# Patient Record
Sex: Male | Born: 1980 | Race: White | Hispanic: No | Marital: Married | Smoking: Current every day smoker
Health system: Southern US, Community
[De-identification: ages and names within clinical notes are randomized; demographics above are authoritative.]

## PROBLEM LIST (undated history)

## (undated) DIAGNOSIS — B019 Varicella without complication: Secondary | ICD-10-CM

## (undated) DIAGNOSIS — A770 Spotted fever due to Rickettsia rickettsii: Secondary | ICD-10-CM

## (undated) HISTORY — DX: Spotted fever due to Rickettsia rickettsii: A77.0

## (undated) HISTORY — DX: Varicella without complication: B01.9

---

## 2011-01-20 ENCOUNTER — Other Ambulatory Visit: Payer: Self-pay | Admitting: Obstetrics and Gynecology

## 2015-04-20 ENCOUNTER — Ambulatory Visit (INDEPENDENT_AMBULATORY_CARE_PROVIDER_SITE_OTHER): Payer: Self-pay | Admitting: Primary Care

## 2015-04-20 ENCOUNTER — Encounter: Payer: Self-pay | Admitting: *Deleted

## 2015-04-20 ENCOUNTER — Encounter: Payer: Self-pay | Admitting: Primary Care

## 2015-04-20 VITALS — BP 132/74 | HR 63 | Temp 97.5°F | Ht 71.5 in | Wt 154.1 lb

## 2015-04-20 DIAGNOSIS — Z0000 Encounter for general adult medical examination without abnormal findings: Secondary | ICD-10-CM | POA: Insufficient documentation

## 2015-04-20 DIAGNOSIS — Z720 Tobacco use: Secondary | ICD-10-CM

## 2015-04-20 LAB — CBC WITH DIFFERENTIAL/PLATELET
BASOS ABS: 0 10*3/uL (ref 0.0–0.1)
Basophils Relative: 0.3 % (ref 0.0–3.0)
EOS ABS: 0.2 10*3/uL (ref 0.0–0.7)
Eosinophils Relative: 1.4 % (ref 0.0–5.0)
HEMATOCRIT: 51.4 % (ref 39.0–52.0)
HEMOGLOBIN: 17.6 g/dL — AB (ref 13.0–17.0)
LYMPHS ABS: 2.4 10*3/uL (ref 0.7–4.0)
Lymphocytes Relative: 20.2 % (ref 12.0–46.0)
MCHC: 34.3 g/dL (ref 30.0–36.0)
MCV: 88.8 fl (ref 78.0–100.0)
MONO ABS: 0.7 10*3/uL (ref 0.1–1.0)
Monocytes Relative: 6.2 % (ref 3.0–12.0)
NEUTROS PCT: 71.9 % (ref 43.0–77.0)
Neutro Abs: 8.6 10*3/uL — ABNORMAL HIGH (ref 1.4–7.7)
Platelets: 247 10*3/uL (ref 150.0–400.0)
RBC: 5.78 Mil/uL (ref 4.22–5.81)
RDW: 12.8 % (ref 11.5–15.5)
WBC: 12 10*3/uL — AB (ref 4.0–10.5)

## 2015-04-20 LAB — LIPID PANEL
CHOLESTEROL: 177 mg/dL (ref 0–200)
HDL: 34.1 mg/dL — AB (ref >39.00)
LDL Cholesterol: 108 mg/dL — ABNORMAL HIGH (ref 0–99)
NonHDL: 142.9
Total CHOL/HDL Ratio: 5
Triglycerides: 175 mg/dL — ABNORMAL HIGH (ref 0.0–149.0)
VLDL: 35 mg/dL (ref 0.0–40.0)

## 2015-04-20 LAB — COMPREHENSIVE METABOLIC PANEL
ALT: 14 U/L (ref 0–53)
AST: 16 U/L (ref 0–37)
Albumin: 4.6 g/dL (ref 3.5–5.2)
Alkaline Phosphatase: 90 U/L (ref 39–117)
BILIRUBIN TOTAL: 0.6 mg/dL (ref 0.2–1.2)
BUN: 14 mg/dL (ref 6–23)
CALCIUM: 9.5 mg/dL (ref 8.4–10.5)
CHLORIDE: 102 meq/L (ref 96–112)
CO2: 30 mEq/L (ref 19–32)
CREATININE: 0.99 mg/dL (ref 0.40–1.50)
GFR: 91.44 mL/min (ref >60.00)
Glucose, Bld: 72 mg/dL (ref 70–99)
Potassium: 4.2 mEq/L (ref 3.5–5.1)
SODIUM: 138 meq/L (ref 135–145)
TOTAL PROTEIN: 7.3 g/dL (ref 6.0–8.3)

## 2015-04-20 LAB — TSH: TSH: 1.49 u[IU]/mL (ref 0.35–4.50)

## 2015-04-20 LAB — HEMOGLOBIN A1C: HEMOGLOBIN A1C: 5.3 % (ref 4.6–6.5)

## 2015-04-20 NOTE — Patient Instructions (Signed)
It's important to decrease your sugar intake in the form of sodas and to increase water intake. You should be getting 3 liters of water daily. Try the nicotine patches and gum for tobacco cessation. Apply the patch and use the gum as needed for cravings. Complete lab work prior to leaving today. I will notify you of your results. It was a pleasure to meet you today! Please don't hesitate to call me with any questions. Welcome to [facility]!

## 2015-04-20 NOTE — Assessment & Plan Note (Signed)
Smokes 1 PPD x 20 years. He is ready to quit and has attempted several times in the past by quitting "cold [place]". Discussed the long term health effects of smoking. Suggested he try the nicotine patches with the gum as needed for oral cravings. Will continue to monitor.

## 2015-04-20 NOTE — Progress Notes (Signed)
Subjective:    Patient ID: Ethan Cline, male    DOB: 1981-11-01, 34 y.o.   MRN: [id number]  HPI  Ethan Cline is a 34 year old male who presents today to establish care and for complete physical.   1) Migraines: Present in the 8th grade with photophobia, phonophobia, nausea. He's not had migraines in over 10 years.  2) Alopecia: Reports chunks of hair loss several years ago. At the time he was going through a lot of personal stress. He was evaluated in the past by MD who attributed his hair loss to stress. Patient reports no labs were obtained. His hair grew back and has had no loss since.   Immunizations: -Tetanus: Completed 6 years ago. -Influenza: Did not receive last season.  Diet: Meat, potatoes, bread, beans, pasta. Limited vegetables, fruit, fast food, water. He drinks mostly sodas, occasional sweet tea. Exercise: He does not regularly exercise but has an active lifestyle.  Eye exam: None recently. Dental exam: Completed 3 months ago   Review of Systems  Constitutional: Negative for fatigue and unexpected weight change.  HENT: Negative for rhinorrhea.   Respiratory: Negative for cough and shortness of breath.   Cardiovascular: Negative for chest pain.  Gastrointestinal: Negative for diarrhea and constipation.  Genitourinary: Negative for dysuria and frequency.  Musculoskeletal: Negative for myalgias and arthralgias.  Skin: Negative for rash.  Allergic/Immunologic: Positive for environmental allergies.  Neurological: Negative for dizziness and headaches.  Psychiatric/Behavioral:       Denies concerns for anxiety or depression       Past Medical History  Diagnosis Date  . Chicken pox   . Rocky Mountain spotted fever     History   Social History  . Marital Status: Married    Spouse Name: N/A  . Number of Children: N/A  . Years of Education: N/A   Occupational History  . Not on file.   Social History Main Topics  . Smoking status: Current Every Day Smoker --  0.50 packs/day for 20 years    Types: Cigarettes  . Smokeless tobacco: Not on file  . Alcohol Use: No  . Drug Use: Not on file  . Sexual Activity: Not on file   Other Topics Concern  . Not on file   Social History Narrative   Married.   1 child.   Highest level of education High School.   Works as a Camera operator.   Enjoys boating, fishing, being outdoors, snowboarding.    History reviewed. No pertinent past surgical history.  Family History  Problem Relation Age of Onset  . Arthritis Mother   . Heart attack Father 71    No Known Allergies  No current outpatient prescriptions on file prior to visit.   No current facility-administered medications on file prior to visit.    BP 132/74 mmHg  Pulse 63  Temp(Src) 97.5 F (36.4 C) (Oral)  Ht 5' 11.5" (1.816 m)  Wt 154 lb 1.9 oz (69.908 kg)  BMI 21.20 kg/m2  SpO2 97%    Objective:   Physical Exam  Constitutional: He is oriented to person, place, and time. He appears well-nourished.  HENT:  Right Ear: Tympanic membrane and ear canal normal.  Left Ear: Tympanic membrane and ear canal normal.  Nose: Nose normal.  Mouth/Throat: Oropharynx is clear and moist.  Eyes: Conjunctivae and EOM are normal. Pupils are equal, round, and reactive to light.  Neck: Neck supple. No thyromegaly present.  Cardiovascular: Normal rate and regular rhythm.  Pulmonary/Chest: Effort normal and breath sounds normal.  Abdominal: Soft. Bowel sounds are normal. He exhibits no mass. There is no tenderness.  Musculoskeletal: Normal range of motion.  Lymphadenopathy:    He has no cervical adenopathy.  Neurological: He is alert and oriented to person, place, and time. He has normal reflexes.  Skin: Skin is warm and dry.  Psychiatric: He has a normal mood and affect.          Assessment & Plan:

## 2015-04-20 NOTE — Progress Notes (Signed)
Pre visit review using our clinic review tool, if applicable. No additional management support is needed unless otherwise documented below in the visit note. 

## 2015-04-20 NOTE — Assessment & Plan Note (Addendum)
Tetanus up to date. Fair diet with the exception of soda consumption. Discussed the importance of cutting back and increasing water intake. Also discussed the importance of tobacco cessation. Recommended eye exam. Dental exam completed this year. Labs today. Unremarkable exam.

## 2015-04-24 ENCOUNTER — Telehealth: Payer: Self-pay | Admitting: Primary Care

## 2015-04-24 NOTE — Telephone Encounter (Signed)
Wife dropped off form for work. Please call [phone] when ready to pick up, thanks.

## 2015-04-24 NOTE — Telephone Encounter (Signed)
Placed form for [name]'s inbox to complete. Will call patient when ready.

## 2015-04-24 NOTE — Telephone Encounter (Signed)
Completed and ready for pick up. Placed in [name]'s Inbox.

## 2015-04-25 NOTE — Telephone Encounter (Signed)
Left voicemail for patient to let him know form is ready for pick up. Left in front office.

## 2015-04-26 ENCOUNTER — Telehealth: Payer: Self-pay | Admitting: Primary Care

## 2015-04-26 NOTE — Telephone Encounter (Signed)
We have attempted multiple times to reach Mr. Donia with multiple voice mails. Spoke with his wife notifying her that a letter was sent to the house with his results. She thanked me for the call.

## 2015-04-26 NOTE — Telephone Encounter (Signed)
Pt's wife picked up work form and asked about his lab results stating they have not heard anything yet. They were expecting a call on Monday. Pt can be reached at [phone].

## 2016-05-05 ENCOUNTER — Ambulatory Visit (INDEPENDENT_AMBULATORY_CARE_PROVIDER_SITE_OTHER)
Admission: RE | Admit: 2016-05-05 | Discharge: 2016-05-05 | Disposition: A | Discharge status: Home / Self Care | Payer: Commercial Managed Care - PPO | Source: Ambulatory Visit | Attending: Primary Care | Admitting: Primary Care

## 2016-05-05 ENCOUNTER — Encounter: Payer: Self-pay | Admitting: Primary Care

## 2016-05-05 ENCOUNTER — Ambulatory Visit (INDEPENDENT_AMBULATORY_CARE_PROVIDER_SITE_OTHER): Payer: Commercial Managed Care - PPO | Admitting: Primary Care

## 2016-05-05 VITALS — BP 132/78 | HR 60 | Temp 98.0°F | Ht 71.5 in | Wt 144.8 lb

## 2016-05-05 DIAGNOSIS — R05 Cough: Secondary | ICD-10-CM | POA: Diagnosis not present

## 2016-05-05 DIAGNOSIS — R059 Cough, unspecified: Secondary | ICD-10-CM

## 2016-05-05 MED ORDER — AMOXICILLIN-POT CLAVULANATE 875-125 MG PO TABS: 1.0000 | ORAL_TABLET | Freq: Two times a day (BID) | ORAL | Status: DC

## 2016-05-05 NOTE — Patient Instructions (Signed)
Start Augmentin antibiotics. Take 1 tablet by mouth twice daily for 10 days.  Cough/Congestion: Try taking Mucinex DM. This will help loosen up the mucous in your chest. Ensure you take this medication with a full glass of water.  You may take tylenol or advil as needed for fever and body aches.  Please notify me if no improvement in 3-4 days.  It was a pleasure to see you today!

## 2016-05-05 NOTE — Progress Notes (Signed)
Subjective:    Patient ID: Ethan Cline, male    DOB: 06/19/1981, 35 y.o.   MRN: [id number]  HPI  Mr. Ethan Cline is a 35 year old male who presents today with a chief complaint of fatigue. He also reports chills, night sweats, cough, post nasal drip. He's not checked his temperature except for yesterday which was 101. His symptoms have been present since Friday June 9th and has continued since. He recently returned from a beach trip. His cough is productive with yellow/clear sputum. He's taken tylenol sinus and Dayquil with some improvement. Denies sinus pressure, sore throat, sick contacts.Current smoker.  Review of Systems  Constitutional: Positive for fever, chills and fatigue.  HENT: Positive for postnasal drip and rhinorrhea. Negative for congestion, sinus pressure and sore throat.   Respiratory: Positive for cough. Negative for shortness of breath.   Cardiovascular: Negative for chest pain.  Musculoskeletal: Negative for myalgias.       Past Medical History  Diagnosis Date  . Chicken pox   . Rocky Mountain spotted fever      Social History   Social History  . Marital Status: Married    Spouse Name: N/A  . Number of Children: N/A  . Years of Education: N/A   Occupational History  . Not on file.   Social History Main Topics  . Smoking status: Current Every Day Smoker -- 0.50 packs/day for 20 years    Types: Cigarettes  . Smokeless tobacco: Not on file  . Alcohol Use: No  . Drug Use: Not on file  . Sexual Activity: Not on file   Other Topics Concern  . Not on file   Social History Narrative   Married.   1 child.   Highest level of education High School.   Works as a Camera operatorcontractor installing fireplaces.   Enjoys boating, fishing, being outdoors, snowboarding.    No past surgical history on file.  Family History  Problem Relation Age of Onset  . Arthritis Mother   . Heart attack Father [phone]    No Known Allergies  No current outpatient prescriptions on file prior  to visit.   No current facility-administered medications on file prior to visit.    BP 132/78 mmHg  Pulse 60  Temp(Src) 98 F (36.7 C) (Oral)  Ht 5' 11.5" (1.816 m)  Wt 144 lb 12.8 oz (65.681 kg)  BMI 19.92 kg/m2  SpO2 95%    Objective:   Physical Exam  Constitutional: He appears well-nourished. He appears ill.  HENT:  Right Ear: Tympanic membrane and ear canal normal.  Left Ear: Tympanic membrane and ear canal normal.  Nose: No mucosal edema. Right sinus exhibits no maxillary sinus tenderness and no frontal sinus tenderness. Left sinus exhibits no maxillary sinus tenderness and no frontal sinus tenderness.  Mouth/Throat: Oropharynx is clear and moist.  Eyes: Conjunctivae are normal.  Neck: Neck supple.  Cardiovascular: Normal rate and regular rhythm.   Pulmonary/Chest: Effort normal. He has no decreased breath sounds. He has no wheezes. He has rhonchi in the right upper field, the right middle field, the right lower field, the left upper field, the left middle field and the left lower field.  Skin: Skin is warm and dry.          Assessment & Plan:  Acute bronchitis:  Cough, fatigue, chills, rhinorrhea for the past 10 days. Fever of 101 yesterday, improvement with Tylenol. Overall symptoms are about the same and cough is productive with yellow clear sputum.  Exam today with moderate rhonchi throughout all lung fields, appears sick. Given examination, presentation, and duration of symptoms will treat and also obtain chest x-ray to rule out pneumonia. Chest x-ray negative for pneumonia but evidence of COPD/likely acute bronchitis. Prescription for Augmentin course sent to pharmacy. Increase fluids, start Mucinex. Return precautions provided.

## 2016-05-05 NOTE — Progress Notes (Signed)
Pre visit review using our clinic review tool, if applicable. No additional management support is needed unless otherwise documented below in the visit note. 

## 2016-05-23 ENCOUNTER — Other Ambulatory Visit: Payer: Self-pay | Admitting: Primary Care

## 2016-05-23 DIAGNOSIS — Z0000 Encounter for general adult medical examination without abnormal findings: Secondary | ICD-10-CM

## 2016-05-23 DIAGNOSIS — E785 Hyperlipidemia, unspecified: Secondary | ICD-10-CM

## 2016-05-28 ENCOUNTER — Other Ambulatory Visit (INDEPENDENT_AMBULATORY_CARE_PROVIDER_SITE_OTHER): Payer: Commercial Managed Care - PPO

## 2016-05-28 DIAGNOSIS — Z0000 Encounter for general adult medical examination without abnormal findings: Secondary | ICD-10-CM | POA: Diagnosis not present

## 2016-05-28 DIAGNOSIS — E785 Hyperlipidemia, unspecified: Secondary | ICD-10-CM

## 2016-05-28 LAB — LIPID PANEL
Cholesterol: 193 mg/dL (ref 0–200)
HDL: 39.4 mg/dL (ref >39.00)
NONHDL: 153.11
Total CHOL/HDL Ratio: 5
Triglycerides: 272 mg/dL — ABNORMAL HIGH (ref 0.0–149.0)
VLDL: 54.4 mg/dL — AB (ref 0.0–40.0)

## 2016-05-28 LAB — COMPREHENSIVE METABOLIC PANEL
ALT: 19 U/L (ref 0–53)
AST: 16 U/L (ref 0–37)
Albumin: 4.4 g/dL (ref 3.5–5.2)
Alkaline Phosphatase: 82 U/L (ref 39–117)
BUN: 16 mg/dL (ref 6–23)
CO2: 29 mEq/L (ref 19–32)
Calcium: 9.6 mg/dL (ref 8.4–10.5)
Chloride: 102 mEq/L (ref 96–112)
Creatinine, Ser: 0.97 mg/dL (ref 0.40–1.50)
GFR: 93.03 mL/min (ref >60.00)
GLUCOSE: 90 mg/dL (ref 70–99)
POTASSIUM: 4.5 meq/L (ref 3.5–5.1)
SODIUM: 139 meq/L (ref 135–145)
Total Bilirubin: 0.7 mg/dL (ref 0.2–1.2)
Total Protein: 6.9 g/dL (ref 6.0–8.3)

## 2016-05-28 LAB — LDL CHOLESTEROL, DIRECT: LDL DIRECT: 130 mg/dL

## 2016-06-02 ENCOUNTER — Encounter: Payer: Self-pay | Admitting: Primary Care

## 2016-06-02 ENCOUNTER — Ambulatory Visit (INDEPENDENT_AMBULATORY_CARE_PROVIDER_SITE_OTHER): Payer: Commercial Managed Care - PPO | Admitting: Primary Care

## 2016-06-02 VITALS — BP 116/76 | HR 73 | Temp 98.4°F | Ht 71.5 in | Wt 153.8 lb

## 2016-06-02 DIAGNOSIS — Z0000 Encounter for general adult medical examination without abnormal findings: Secondary | ICD-10-CM | POA: Diagnosis not present

## 2016-06-02 DIAGNOSIS — E785 Hyperlipidemia, unspecified: Secondary | ICD-10-CM | POA: Diagnosis not present

## 2016-06-02 NOTE — Progress Notes (Signed)
Pre visit review using our clinic review tool, if applicable. No additional management support is needed unless otherwise documented below in the visit note. 

## 2016-06-02 NOTE — Progress Notes (Signed)
Subjective:    Patient ID: Hillard DankerJames H Coomes, male    DOB: 12/17/1980, 35 y.o.   MRN: [id number]  HPI  [name] is a 35 year old male who presents today for complete physical.  Immunizations: -Tetanus: Completed in 2010 -Influenza: Did not receive last seasn   Diet: He endorses a fair diet Breakfast: Biscuit occasionally, ham Lunch: Ham sandwich, crackers Dinner: Meat, potatoes, beans, corn, bread Snacks: None Desserts: Ice cream, occasionally Beverages: Water, Gatorade, sodas  Exercise: He does not currently exercise. Eye exam: Completed several years ago. Dental exam: Completes annually   Review of Systems  Constitutional: Negative for unexpected weight change.  HENT: Negative for rhinorrhea.   Respiratory: Negative for cough and shortness of breath.   Cardiovascular: Negative for chest pain.  Gastrointestinal: Negative for diarrhea and constipation.  Genitourinary: Negative for difficulty urinating.  Musculoskeletal: Negative for myalgias and arthralgias.  Skin: Negative for rash.  Allergic/Immunologic: Negative for environmental allergies.  Neurological: Negative for dizziness, numbness and headaches.  Psychiatric/Behavioral:       Denies concerns for anxiety or depression       Past Medical History  Diagnosis Date  . Chicken pox   . Rocky Mountain spotted fever      Social History   Social History  . Marital Status: Married    Spouse Name: N/A  . Number of Children: N/A  . Years of Education: N/A   Occupational History  . Not on file.   Social History Main Topics  . Smoking status: Current Every Day Smoker -- 0.50 packs/day for 20 years    Types: Cigarettes  . Smokeless tobacco: Not on file  . Alcohol Use: No  . Drug Use: Not on file  . Sexual Activity: Not on file   Other Topics Concern  . Not on file   Social History Narrative   Married.   1 child.   Highest level of education High School.   Works as a Camera operatorcontractor installing fireplaces.   Enjoys boating, fishing, being outdoors, snowboarding.    No past surgical history on file.  Family History  Problem Relation Age of Onset  . Arthritis Mother   . Heart attack Father [phone]    No Known Allergies  No current outpatient prescriptions on file prior to visit.   No current facility-administered medications on file prior to visit.    BP 116/76 mmHg  Pulse 73  Temp(Src) 98.4 F (36.9 C) (Oral)  Ht 5' 11.5" (1.816 m)  Wt 153 lb 12.8 oz (69.763 kg)  BMI 21.15 kg/m2  SpO2 98%    Objective:   Physical Exam  Constitutional: He is oriented to person, place, and time. He appears well-nourished.  HENT:  Right Ear: Tympanic membrane and ear canal normal.  Left Ear: Tympanic membrane and ear canal normal.  Nose: Nose normal. Right sinus exhibits no maxillary sinus tenderness and no frontal sinus tenderness. Left sinus exhibits no maxillary sinus tenderness and no frontal sinus tenderness.  Mouth/Throat: Oropharynx is clear and moist.  Eyes: Conjunctivae and EOM are normal. Pupils are equal, round, and reactive to light.  Neck: Neck supple. Carotid bruit is not present. No thyromegaly present.  Cardiovascular: Normal rate, regular rhythm and normal heart sounds.   Pulmonary/Chest: Effort normal and breath sounds normal. He has no wheezes. He has no rales.  Abdominal: Soft. Bowel sounds are normal. There is no tenderness.  Musculoskeletal: Normal range of motion.  Neurological: He is alert and oriented to person, place,  and time. He has normal reflexes. No cranial nerve deficit.  Skin: Skin is warm and dry.  Psychiatric: He has a normal mood and affect.          Assessment & Plan:

## 2016-06-02 NOTE — Patient Instructions (Signed)
Your triglyceride levels are too high. Work to [employer] by limiting fried foods, fatty foods, starchy foods.  Start Fish Oil 1000 mg once daily with a meal.  Ensure you are consuming 64 ounces of water daily.  Start exercising. You should be getting 1 hour of moderate intensity exercise 5 days weekly.  Schedule a lab only appointment to recheck your cholesterol in 4 months.  Follow up in 1 year for repeat physical exam.  It was a pleasure to see you today!  Food Choices to Lower Your Triglycerides Triglycerides are a type of fat in your blood. High levels of triglycerides can increase the risk of heart disease and stroke. If your triglyceride levels are high, the foods you eat and your eating habits are very important. Choosing the right foods can help lower your triglycerides.  WHAT GENERAL GUIDELINES DO I NEED TO FOLLOW?  Lose weight if you are overweight.   Limit or avoid alcohol.   Fill one half of your plate with vegetables and green salads.   Limit fruit to two servings a day. Choose fruit instead of juice.   Make one fourth of your plate whole grains. Look for the word "whole" as the first word in the ingredient list.  Fill one fourth of your plate with lean protein foods.  Enjoy fatty fish (such as salmon, mackerel, sardines, and tuna) three times a week.   Choose healthy fats.   Limit foods high in starch and sugar.  Eat more home-cooked food and less restaurant, buffet, and fast food.  Limit fried foods.  [place] foods using methods other than frying.  Limit saturated fats.  Check ingredient lists to avoid foods with partially hydrogenated oils (trans fats) in them. WHAT FOODS CAN I EAT?  Grains Whole grains, such as whole wheat or whole grain breads, crackers, cereals, and pasta. Unsweetened oatmeal, bulgur, barley, quinoa, or brown rice. Corn or whole wheat flour tortillas.  Vegetables Fresh or frozen vegetables (raw, steamed, roasted, or  grilled). Green salads. Fruits All fresh, canned (in natural juice), or frozen fruits. Meat and Other Protein Products Ground beef (85% or leaner), grass-fed beef, or beef trimmed of fat. Skinless chicken or [place]. Ground chicken or [place]. Pork trimmed of fat. All fish and seafood. Eggs. Dried beans, peas, or lentils. Unsalted nuts or seeds. Unsalted canned or dry beans. Dairy Low-fat dairy products, such as skim or 1% milk, 2% or reduced-fat cheeses, low-fat ricotta or cottage cheese, or plain low-fat yogurt. Fats and Oils Tub margarines without trans fats. Light or reduced-fat mayonnaise and salad dressings. Avocado. Safflower, olive, or canola oils. Natural peanut or almond butter. The items listed above may not be a complete list of recommended foods or beverages. Contact your dietitian for more options. WHAT FOODS ARE NOT RECOMMENDED?  Grains White bread. White pasta. White rice. Cornbread. Bagels, pastries, and croissants. Crackers that contain trans fat. Vegetables White potatoes. Corn. Creamed or fried vegetables. Vegetables in a cheese sauce. Fruits Dried fruits. Canned fruit in light or heavy syrup. Fruit juice. Meat and Other Protein Products Fatty cuts of meat. Ribs, chicken wings, bacon, sausage, bologna, salami, chitterlings, fatback, hot dogs, bratwurst, and packaged luncheon meats. Dairy Whole or 2% milk, cream, half-and-half, and cream cheese. Whole-fat or sweetened yogurt. Full-fat cheeses. Nondairy creamers and whipped toppings. Processed cheese, cheese spreads, or cheese curds. Sweets and Desserts Corn syrup, sugars, honey, and molasses. Candy. Jam and jelly. Syrup. Sweetened cereals. Cookies, pies, cakes, donuts, muffins, and ice cream.  Fats and Oils Butter, stick margarine, lard, shortening, ghee, or bacon fat. Coconut, palm kernel, or palm oils. Beverages Alcohol. Sweetened drinks (such as sodas, lemonade, and fruit drinks or punches). The items listed above may  not be a complete list of foods and beverages to avoid. Contact your dietitian for more information.   This information is not intended to replace advice given to you by your health care provider. Make sure you discuss any questions you have with your health care provider.   Document Released: 08/21/2004 Document Revised: 11/24/2014 Document Reviewed: 09/07/2013 Elsevier Interactive Patient Education [employer]2016 Elsevier Inc.

## 2016-06-02 NOTE — Assessment & Plan Note (Signed)
Td UTD. Poor diet with high salt content, no vegetables. Does not exercise. Discussed the importance of a healthy diet and regular exercise in order for weight loss and to reduce risk of other medical diseases. Labs with elevated triglycerides, borderline LDL. Handout provided regarding food choices to lower cholesterol. Will recheck in 4 months. Exam unremarkable. Follow up in 1 year for repeat physical.

## 2016-10-03 ENCOUNTER — Other Ambulatory Visit: Payer: Commercial Managed Care - PPO

## 2017-03-04 IMAGING — DX DG CHEST 2V
2 series · 2 of 2 positions shown · non-contrast
Comparison: None in PACs

CLINICAL DATA: Shortness of breath and productive cough and chest
discomfort with exertion, also intermittent fever, symptoms for the
past several weeks, current smoker.

EXAM:
CHEST  2 VIEW

[chest lat]
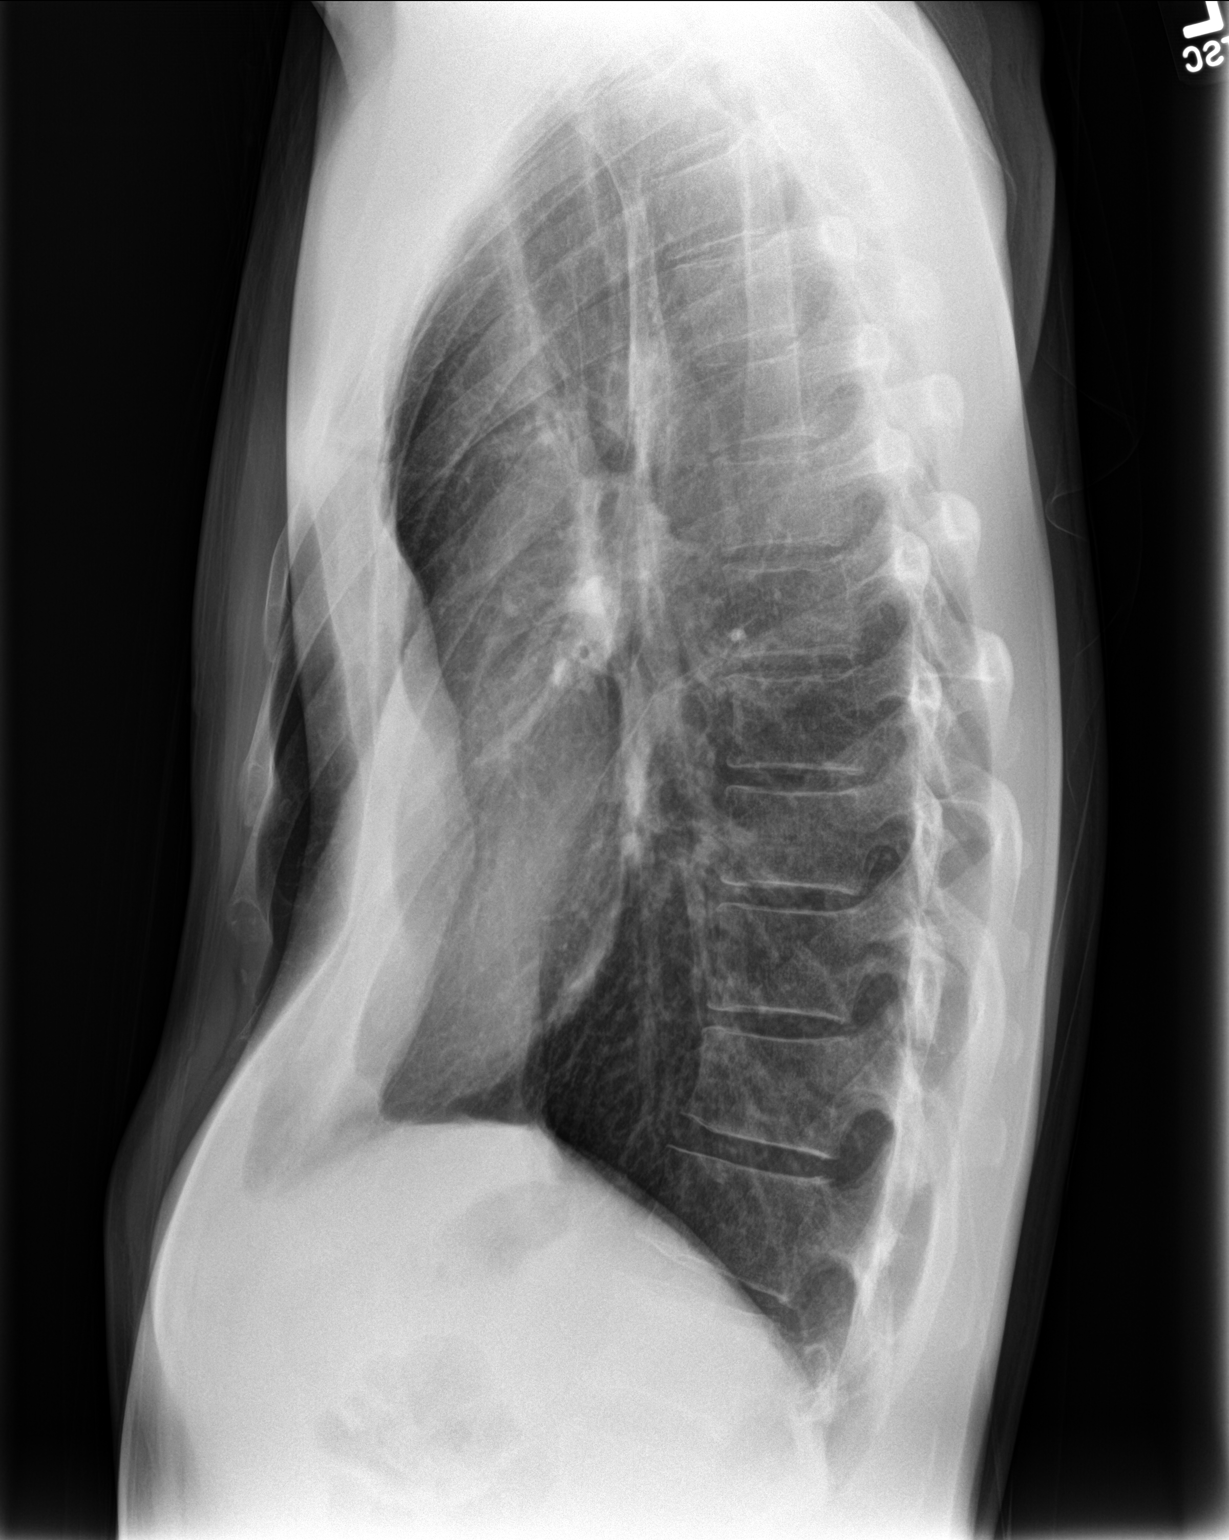

[chest pa]
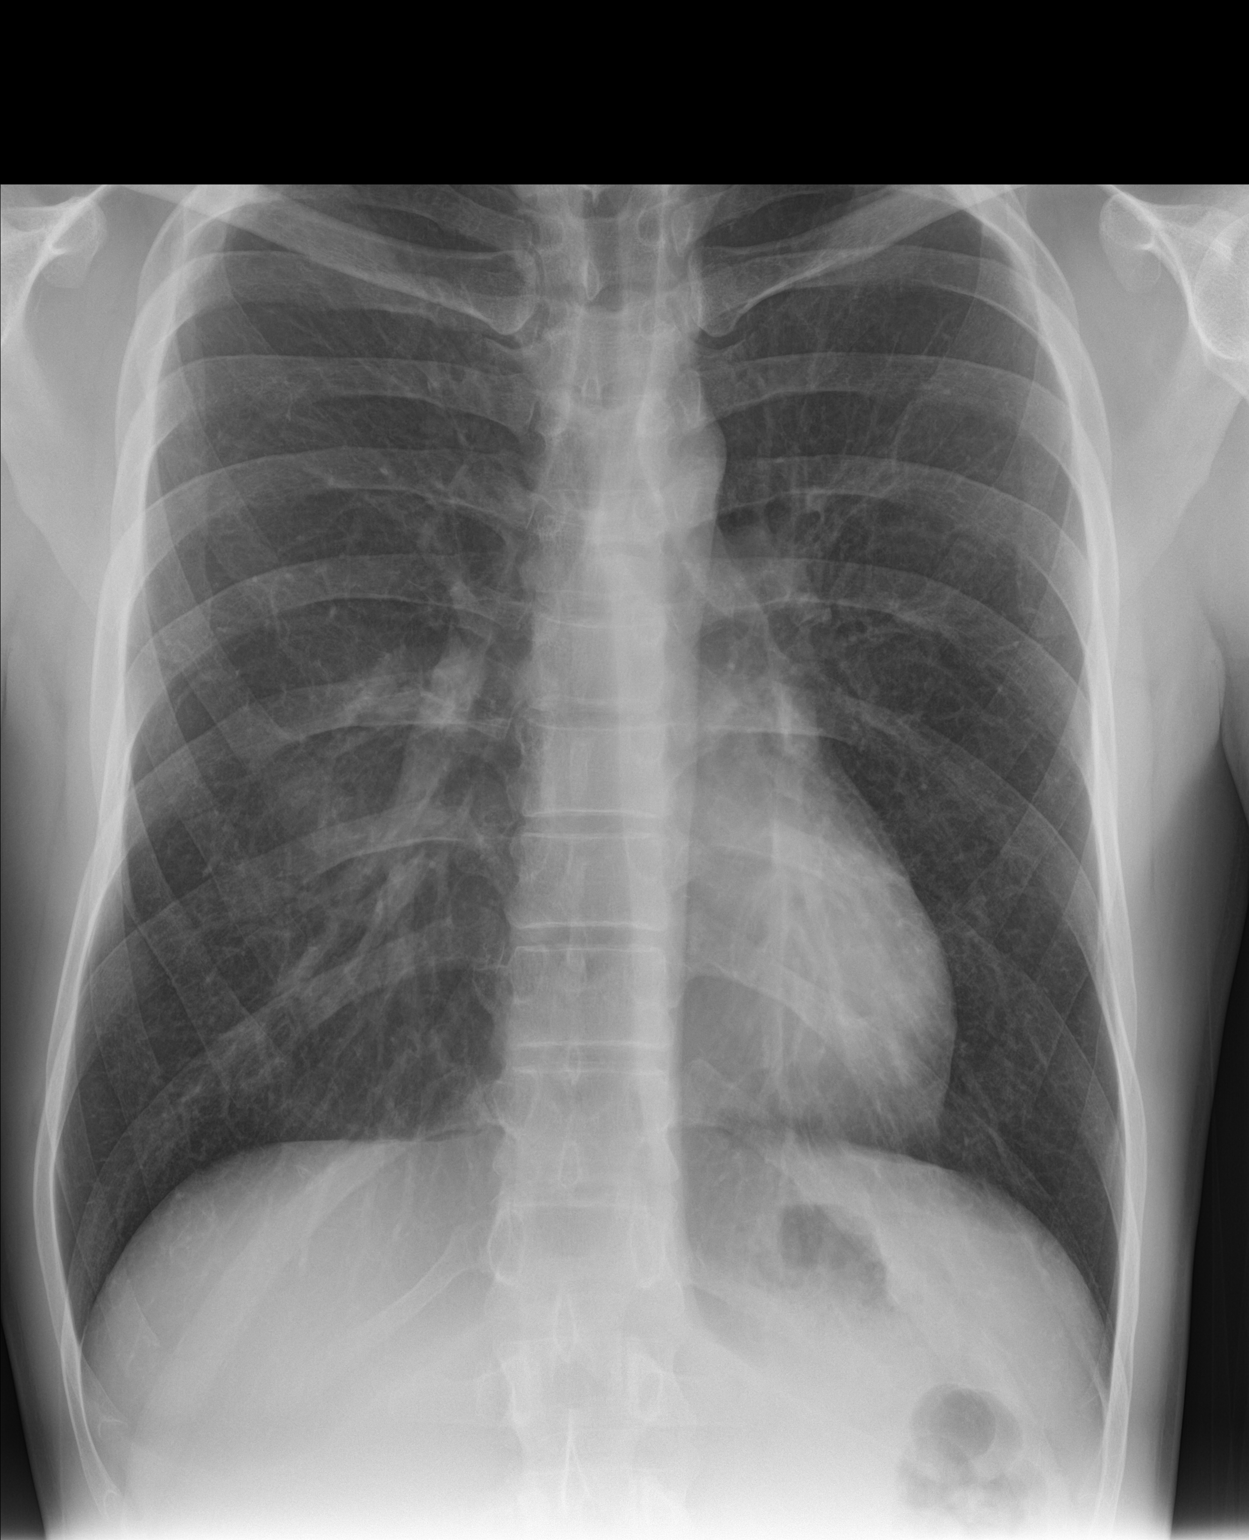

[2 of 2 positions shown; findings below may reference images not displayed]

FINDINGS: The lungs are hyperinflated. There is a pectus excavatum type chest
contour. There is no alveolar infiltrate. There is no pleural
effusion or pneumothorax. The heart and pulmonary vascularity are
normal. The trachea is midline. The bony thorax is unremarkable.
IMPRESSION: Hyperinflation consistent with reactive airway disease or COPD.
There is no pneumonia nor other acute cardiopulmonary abnormality.

## 2017-05-22 ENCOUNTER — Other Ambulatory Visit: Payer: Self-pay | Admitting: Primary Care

## 2017-05-22 DIAGNOSIS — E785 Hyperlipidemia, unspecified: Secondary | ICD-10-CM

## 2017-05-27 ENCOUNTER — Other Ambulatory Visit (INDEPENDENT_AMBULATORY_CARE_PROVIDER_SITE_OTHER): Payer: Commercial Managed Care - PPO

## 2017-05-27 DIAGNOSIS — E785 Hyperlipidemia, unspecified: Secondary | ICD-10-CM

## 2017-05-27 LAB — COMPREHENSIVE METABOLIC PANEL
ALK PHOS: 86 U/L (ref 39–117)
ALT: 14 U/L (ref 0–53)
AST: 17 U/L (ref 0–37)
Albumin: 4.5 g/dL (ref 3.5–5.2)
BILIRUBIN TOTAL: 0.8 mg/dL (ref 0.2–1.2)
BUN: 14 mg/dL (ref 6–23)
CO2: 28 mEq/L (ref 19–32)
CREATININE: 0.85 mg/dL (ref 0.40–1.50)
Calcium: 9.7 mg/dL (ref 8.4–10.5)
Chloride: 103 mEq/L (ref 96–112)
GFR: 107.75 mL/min (ref >60.00)
GLUCOSE: 106 mg/dL — AB (ref 70–99)
Potassium: 4.1 mEq/L (ref 3.5–5.1)
SODIUM: 139 meq/L (ref 135–145)
TOTAL PROTEIN: 7.2 g/dL (ref 6.0–8.3)

## 2017-05-27 LAB — LIPID PANEL
Cholesterol: 185 mg/dL (ref 0–200)
HDL: 45.1 mg/dL (ref >39.00)
LDL Cholesterol: 105 mg/dL — ABNORMAL HIGH (ref 0–99)
NONHDL: 139.59
Total CHOL/HDL Ratio: 4
Triglycerides: 171 mg/dL — ABNORMAL HIGH (ref 0.0–149.0)
VLDL: 34.2 mg/dL (ref 0.0–40.0)

## 2017-06-03 ENCOUNTER — Encounter: Payer: Self-pay | Admitting: Primary Care

## 2017-06-03 ENCOUNTER — Ambulatory Visit (INDEPENDENT_AMBULATORY_CARE_PROVIDER_SITE_OTHER): Payer: Commercial Managed Care - PPO | Admitting: Primary Care

## 2017-06-03 VITALS — BP 122/76 | HR 66 | Temp 98.0°F | Ht 72.0 in | Wt 145.1 lb

## 2017-06-03 DIAGNOSIS — Z720 Tobacco use: Secondary | ICD-10-CM | POA: Diagnosis not present

## 2017-06-03 DIAGNOSIS — E781 Pure hyperglyceridemia: Secondary | ICD-10-CM

## 2017-06-03 DIAGNOSIS — Z0000 Encounter for general adult medical examination without abnormal findings: Secondary | ICD-10-CM

## 2017-06-03 NOTE — Progress Notes (Signed)
Subjective:    Patient ID: Ethan Cline, male    DOB: 1981/08/24, 36 y.o.   MRN: [id number]  HPI  Mr. Slape is a 36 year old male who presents today for complete physical.  Immunizations: -Tetanus: Completed in 2010 -Influenza: Did not receive last season   Diet: He endorses a poor diet. Breakfast: Coffee with sugar and milk Lunch: Fast food Dinner: Steak, chicken, potatoes, beans, corn Snacks: None Desserts: None Beverages: Coffee, soda, sweet tea, water, Gatorade  Exercise: He does push-ups and sit ups daily. Eye exam: Completed years ago. Dental exam: Completes annually   Review of Systems  Constitutional: Negative for unexpected weight change.  HENT: Negative for rhinorrhea.   Respiratory: Negative for cough and shortness of breath.   Cardiovascular: Negative for chest pain.  Gastrointestinal: Negative for constipation and diarrhea.  Genitourinary: Negative for difficulty urinating.  Musculoskeletal: Negative for arthralgias and myalgias.       Mild left shoulder pain since rolling after doing a handstand on carpet. No decrease in ROM.  Skin: Negative for rash.  Allergic/Immunologic: Negative for environmental allergies.  Neurological: Negative for dizziness, numbness and headaches.  Psychiatric/Behavioral:       Denies concerns for anxiety and depression       Past Medical History:  Diagnosis Date  . Chicken pox   . Rocky Mountain spotted fever      Social History   Social History  . Marital status: Married    Spouse name: N/A  . Number of children: N/A  . Years of education: N/A   Occupational History  . Not on file.   Social History Main Topics  . Smoking status: Current Every Day Smoker    Packs/day: 0.50    Years: 20.00    Types: Cigarettes  . Smokeless tobacco: Never Used  . Alcohol use No  . Drug use: Unknown  . Sexual activity: Not on file   Other Topics Concern  . Not on file   Social History Narrative   Married.   1 child.   Highest level of education High School.   Works as a Camera operator.   Enjoys boating, fishing, being outdoors, snowboarding.    No past surgical history on file.  Family History  Problem Relation Age of Onset  . Arthritis Mother   . Heart attack Father 40    No Known Allergies  No current outpatient prescriptions on file prior to visit.   No current facility-administered medications on file prior to visit.     BP 122/76   Pulse 66   Temp 98 F (36.7 C) (Oral)   Ht 6' (1.829 m)   Wt 145 lb 1.9 oz (65.8 kg)   SpO2 97%   BMI 19.68 kg/m    Objective:   Physical Exam  Constitutional: He is oriented to person, place, and time. He appears well-nourished.  HENT:  Right Ear: Tympanic membrane and ear canal normal.  Left Ear: Tympanic membrane and ear canal normal.  Nose: Nose normal. Right sinus exhibits no maxillary sinus tenderness and no frontal sinus tenderness. Left sinus exhibits no maxillary sinus tenderness and no frontal sinus tenderness.  Mouth/Throat: Oropharynx is clear and moist.  Eyes: Pupils are equal, round, and reactive to light. Conjunctivae and EOM are normal.  Neck: Neck supple. Carotid bruit is not present. No thyromegaly present.  Cardiovascular: Normal rate, regular rhythm and normal heart sounds.   Pulmonary/Chest: Effort normal and breath sounds normal. He has no wheezes.  He has no rales.  Abdominal: Soft. Bowel sounds are normal. There is no tenderness.  Musculoskeletal: Normal range of motion.  Neurological: He is alert and oriented to person, place, and time. He has normal reflexes. No cranial nerve deficit.  Skin: Skin is warm and dry.  Psychiatric: He has a normal mood and affect.          Assessment & Plan:

## 2017-06-03 NOTE — Assessment & Plan Note (Signed)
Discussed importance of tobacco cessation, he is cutting back now.

## 2017-06-03 NOTE — Patient Instructions (Signed)
It's important to improve your diet by reducing consumption of fast food, fried food, processed snack foods, sugary drinks. Increase consumption of fresh vegetables and fruits, whole grains, water.  Ensure you are drinking 64 ounces of water daily.  Start exercising. You should be getting 150 minutes of moderate intensity exercise weekly.  Stop smoking, this places you at risk for heart disease.  Your cholesterol has improved which is good news. Continue to work on improvements in your diet as discussed.  Follow up in 1 year for your annual exam.  It was a pleasure to see you today!

## 2017-06-03 NOTE — Assessment & Plan Note (Signed)
Improved since last year, commended him on his efforts to improve his diet. Discussed to limit fried/fatty/sugary foods and drinks.

## 2017-06-03 NOTE — Assessment & Plan Note (Signed)
Td UTD. Discussed to work on improvements in diet and start regular exercise. Exam unremarkable. Labs with improved cholesterol and triglycerides.  Follow up in 1 year for annual exam.

## 2018-07-26 ENCOUNTER — Other Ambulatory Visit: Payer: Self-pay | Admitting: Primary Care

## 2018-07-26 DIAGNOSIS — E781 Pure hyperglyceridemia: Secondary | ICD-10-CM

## 2018-07-26 DIAGNOSIS — Z0000 Encounter for general adult medical examination without abnormal findings: Secondary | ICD-10-CM

## 2018-08-04 ENCOUNTER — Other Ambulatory Visit (INDEPENDENT_AMBULATORY_CARE_PROVIDER_SITE_OTHER): Payer: Commercial Managed Care - PPO

## 2018-08-04 DIAGNOSIS — E781 Pure hyperglyceridemia: Secondary | ICD-10-CM | POA: Diagnosis not present

## 2018-08-04 DIAGNOSIS — Z0000 Encounter for general adult medical examination without abnormal findings: Secondary | ICD-10-CM

## 2018-08-04 LAB — COMPREHENSIVE METABOLIC PANEL
ALK PHOS: 83 U/L (ref 39–117)
ALT: 17 U/L (ref 0–53)
AST: 17 U/L (ref 0–37)
Albumin: 4.5 g/dL (ref 3.5–5.2)
BUN: 16 mg/dL (ref 6–23)
CO2: 29 mEq/L (ref 19–32)
Calcium: 9.6 mg/dL (ref 8.4–10.5)
Chloride: 103 mEq/L (ref 96–112)
Creatinine, Ser: 0.97 mg/dL (ref 0.40–1.50)
GFR: 91.92 mL/min (ref >60.00)
GLUCOSE: 79 mg/dL (ref 70–99)
POTASSIUM: 4.3 meq/L (ref 3.5–5.1)
SODIUM: 139 meq/L (ref 135–145)
TOTAL PROTEIN: 7 g/dL (ref 6.0–8.3)
Total Bilirubin: 1.2 mg/dL (ref 0.2–1.2)

## 2018-08-04 LAB — HEMOGLOBIN A1C: HEMOGLOBIN A1C: 5.8 % (ref 4.6–6.5)

## 2018-08-04 LAB — LIPID PANEL
CHOL/HDL RATIO: 4
Cholesterol: 179 mg/dL (ref 0–200)
HDL: 42.9 mg/dL (ref >39.00)
LDL Cholesterol: 103 mg/dL — ABNORMAL HIGH (ref 0–99)
NonHDL: 136.36
TRIGLYCERIDES: 168 mg/dL — AB (ref 0.0–149.0)
VLDL: 33.6 mg/dL (ref 0.0–40.0)

## 2018-08-11 ENCOUNTER — Ambulatory Visit (INDEPENDENT_AMBULATORY_CARE_PROVIDER_SITE_OTHER): Payer: Commercial Managed Care - PPO | Admitting: Primary Care

## 2018-08-11 ENCOUNTER — Encounter: Payer: Self-pay | Admitting: Primary Care

## 2018-08-11 VITALS — BP 120/78 | HR 57 | Temp 97.7°F | Ht 72.0 in | Wt 150.5 lb

## 2018-08-11 DIAGNOSIS — E781 Pure hyperglyceridemia: Secondary | ICD-10-CM

## 2018-08-11 DIAGNOSIS — Z0000 Encounter for general adult medical examination without abnormal findings: Secondary | ICD-10-CM | POA: Diagnosis not present

## 2018-08-11 DIAGNOSIS — Z720 Tobacco use: Secondary | ICD-10-CM

## 2018-08-11 NOTE — Progress Notes (Signed)
Subjective:    Patient ID: ANTHON HARPOLE, male    DOB: 06/05/1981, 37 y.o.   MRN: [id number]  HPI  Mr. Dowson is a 37 year old male who presents today for complete physical. He has no complaints today.   Immunizations: -Tetanus: Completed in 2010 -Influenza: Declines  Diet: He endorses a healthy diet Breakfast: Skips Lunch: Arts development officer: Grilled chicken, hamburgers, steak, pork chops, corn, beans, fried okra, potatoes Snacks: None Desserts: Occasionally  Beverages: Gatorade, soda, little water, sweet tea  Exercise: He is active at work, does not exercise routinely  Eye exam: Completed this year Dental exam: No recent exam   Review of Systems  Constitutional: Negative for unexpected weight change.  HENT: Negative for rhinorrhea.   Respiratory: Negative for cough and shortness of breath.   Cardiovascular: Negative for chest pain.  Gastrointestinal: Negative for constipation and diarrhea.  Genitourinary: Negative for difficulty urinating.  Musculoskeletal: Negative for arthralgias and myalgias.  Skin: Negative for rash.  Allergic/Immunologic: Negative for environmental allergies.  Neurological: Negative for dizziness, numbness and headaches.  Psychiatric/Behavioral: The patient is not nervous/anxious.        Past Medical History:  Diagnosis Date  . Chicken pox   . Rocky Mountain spotted fever      Social History   Socioeconomic History  . Marital status: Married    Spouse name: Not on file  . Number of children: Not on file  . Years of education: Not on file  . Highest education level: Not on file  Occupational History  . Not on file  Social Needs  . Financial resource strain: Not on file  . Food insecurity:    Worry: Not on file    Inability: Not on file  . Transportation needs:    Medical: Not on file    Non-medical: Not on file  Tobacco Use  . Smoking status: Current Every Day Smoker    Packs/day: 0.50    Years: 20.00    Pack years: 10.00    Types: Cigarettes  . Smokeless tobacco: Never Used  Substance and Sexual Activity  . Alcohol use: No    Alcohol/week: 0.0 standard drinks  . Drug use: Not on file  . Sexual activity: Not on file  Lifestyle  . Physical activity:    Days per week: Not on file    Minutes per session: Not on file  . Stress: Not on file  Relationships  . Social connections:    Talks on phone: Not on file    Gets together: Not on file    Attends religious service: Not on file    Active member of club or organization: Not on file    Attends meetings of clubs or organizations: Not on file    Relationship status: Not on file  . Intimate partner violence:    Fear of current or ex partner: Not on file    Emotionally abused: Not on file    Physically abused: Not on file    Forced sexual activity: Not on file  Other Topics Concern  . Not on file  Social History Narrative   Married.   1 child.   Highest level of education High School.   Works as a Camera operator.   Enjoys boating, fishing, being outdoors, snowboarding.    No past surgical history on file.  Family History  Problem Relation Age of Onset  . Arthritis Mother   . Heart attack Father 30    No  Known Allergies  No current outpatient medications on file prior to visit.   No current facility-administered medications on file prior to visit.     BP 120/78   Pulse (!) 57   Temp 97.7 F (36.5 C) (Oral)   Ht 6' (1.829 m)   Wt 150 lb 8 oz (68.3 kg)   SpO2 97%   BMI 20.41 kg/m    Objective:   Physical Exam  Constitutional: He is oriented to person, place, and time. He appears well-nourished.  HENT:  Mouth/Throat: No oropharyngeal exudate.  Eyes: Pupils are equal, round, and reactive to light. EOM are normal.  Neck: Neck supple. No thyromegaly present.  Cardiovascular: Normal rate and regular rhythm.  Respiratory: Effort normal and breath sounds normal.  GI: Soft. Bowel sounds are normal. There is no  tenderness.  Musculoskeletal: Normal range of motion.  Neurological: He is alert and oriented to person, place, and time.  Skin: Skin is warm and dry.  Psychiatric: He has a normal mood and affect.           Assessment & Plan:

## 2018-08-11 NOTE — Patient Instructions (Signed)
Ensure you are consuming 64 ounces of water daily.  Increase vegetables, fruit, whole grains, lean protein. Limit restaurant food, greasy/fried food, sugary drinks.  Continue to remain active.  Stop smoking, this is very important.   We'll see you next year or sooner if needed.  It was a pleasure to see you today!   Preventive Care 18-39 Years, Male Preventive care refers to lifestyle choices and visits with your health care provider that can promote health and wellness. What does preventive care include?  A yearly physical exam. This is also called an annual well check.  Dental exams once or twice a year.  Routine eye exams. Ask your health care provider how often you should have your eyes checked.  Personal lifestyle choices, including: ? Daily care of your teeth and gums. ? Regular physical activity. ? Eating a healthy diet. ? Avoiding tobacco and drug use. ? Limiting alcohol use. ? Practicing safe sex. What happens during an annual well check? The services and screenings done by your health care provider during your annual well check will depend on your age, overall health, lifestyle risk factors, and family history of disease. Counseling Your health care provider may ask you questions about your:  Alcohol use.  Tobacco use.  Drug use.  Emotional well-being.  Home and relationship well-being.  Sexual activity.  Eating habits.  Work and work Statistician.  Screening You may have the following tests or measurements:  Height, weight, and BMI.  Blood pressure.  Lipid and cholesterol levels. These may be checked every 5 years starting at age 70.  Diabetes screening. This is done by checking your blood sugar (glucose) after you have not eaten for a while (fasting).  Skin check.  Hepatitis C blood test.  Hepatitis B blood test.  Sexually transmitted disease (STD) testing.  Discuss your test results, treatment options, and if necessary, the need for  more tests with your health care provider. Vaccines Your health care provider may recommend certain vaccines, such as:  Influenza vaccine. This is recommended every year.  Tetanus, diphtheria, and acellular pertussis (Tdap, Td) vaccine. You may need a Td booster every 10 years.  Varicella vaccine. You may need this if you have not been vaccinated.  HPV vaccine. If you are 24 or younger, you may need three doses over 6 months.  Measles, mumps, and rubella (MMR) vaccine. You may need at least one dose of MMR.You may also need a second dose.  Pneumococcal 13-valent conjugate (PCV13) vaccine. You may need this if you have certain conditions and have not been vaccinated.  Pneumococcal polysaccharide (PPSV23) vaccine. You may need one or two doses if you smoke cigarettes or if you have certain conditions.  Meningococcal vaccine. One dose is recommended if you are age 18-21 years and a first-year college student living in a residence hall, or if you have one of several medical conditions. You may also need additional booster doses.  Hepatitis A vaccine. You may need this if you have certain conditions or if you travel or work in places where you may be exposed to hepatitis A.  Hepatitis B vaccine. You may need this if you have certain conditions or if you travel or work in places where you may be exposed to hepatitis B.  Haemophilus influenzae type b (Hib) vaccine. You may need this if you have certain risk factors.  Talk to your health care provider about which screenings and vaccines you need and how often you need them. This information is  not intended to replace advice given to you by your health care provider. Make sure you discuss any questions you have with your health care provider. Document Released: 12/30/2001 Document Revised: 07/23/2016 Document Reviewed: 09/04/2015 Elsevier Interactive Patient Education  [employer].

## 2018-08-11 NOTE — Assessment & Plan Note (Signed)
Td UTD, due in 2020. Declines influenza vaccination. Recommend to work on diet, continue regular activity.  Exam unremarkable. Labs reviewed. Follow up in 1 year for CPE

## 2018-08-11 NOTE — Assessment & Plan Note (Signed)
Above goal but overall improved over the last 2 years. Discussed to limit restaurant/greasy foods.

## 2018-08-11 NOTE — Assessment & Plan Note (Signed)
Continues to smoke, discussed to quit. He is contemplating.

## 2019-08-31 ENCOUNTER — Encounter: Payer: Self-pay | Admitting: Primary Care

## 2019-08-31 ENCOUNTER — Ambulatory Visit (INDEPENDENT_AMBULATORY_CARE_PROVIDER_SITE_OTHER): Payer: Commercial Managed Care - PPO | Admitting: Primary Care

## 2019-08-31 ENCOUNTER — Other Ambulatory Visit: Payer: Self-pay

## 2019-08-31 VITALS — BP 116/72 | HR 80 | Temp 97.6°F | Ht 72.0 in | Wt 151.0 lb

## 2019-08-31 DIAGNOSIS — R7303 Prediabetes: Secondary | ICD-10-CM

## 2019-08-31 DIAGNOSIS — E781 Pure hyperglyceridemia: Secondary | ICD-10-CM

## 2019-08-31 DIAGNOSIS — Z0000 Encounter for general adult medical examination without abnormal findings: Secondary | ICD-10-CM

## 2019-08-31 DIAGNOSIS — Z23 Encounter for immunization: Secondary | ICD-10-CM

## 2019-08-31 DIAGNOSIS — Z720 Tobacco use: Secondary | ICD-10-CM

## 2019-08-31 LAB — LIPID PANEL
Cholesterol: 177 mg/dL (ref 0–200)
HDL: 39.8 mg/dL (ref >39.00)
LDL Cholesterol: 104 mg/dL — ABNORMAL HIGH (ref 0–99)
NonHDL: 137.27
Total CHOL/HDL Ratio: 4
Triglycerides: 165 mg/dL — ABNORMAL HIGH (ref 0.0–149.0)
VLDL: 33 mg/dL (ref 0.0–40.0)

## 2019-08-31 LAB — COMPREHENSIVE METABOLIC PANEL
ALT: 14 U/L (ref 0–53)
AST: 18 U/L (ref 0–37)
Albumin: 4.6 g/dL (ref 3.5–5.2)
Alkaline Phosphatase: 80 U/L (ref 39–117)
BUN: 14 mg/dL (ref 6–23)
CO2: 29 mEq/L (ref 19–32)
Calcium: 9.4 mg/dL (ref 8.4–10.5)
Chloride: 103 mEq/L (ref 96–112)
Creatinine, Ser: 0.97 mg/dL (ref 0.40–1.50)
GFR: 86 mL/min (ref >60.00)
Glucose, Bld: 97 mg/dL (ref 70–99)
Potassium: 4.4 mEq/L (ref 3.5–5.1)
Sodium: 139 mEq/L (ref 135–145)
Total Bilirubin: 0.7 mg/dL (ref 0.2–1.2)
Total Protein: 6.9 g/dL (ref 6.0–8.3)

## 2019-08-31 LAB — CBC
HCT: 49.4 % (ref 39.0–52.0)
Hemoglobin: 16.9 g/dL (ref 13.0–17.0)
MCHC: 34.1 g/dL (ref 30.0–36.0)
MCV: 90.1 fl (ref 78.0–100.0)
Platelets: 227 10*3/uL (ref 150.0–400.0)
RBC: 5.48 Mil/uL (ref 4.22–5.81)
RDW: 12.7 % (ref 11.5–15.5)
WBC: 11.6 10*3/uL — ABNORMAL HIGH (ref 4.0–10.5)

## 2019-08-31 LAB — HEMOGLOBIN A1C: Hgb A1c MFr Bld: 5.8 % (ref 4.6–6.5)

## 2019-08-31 NOTE — Assessment & Plan Note (Signed)
Continues to smoke 1 PPD, not ready to quit.

## 2019-08-31 NOTE — Addendum Note (Signed)
Addended by: Jacqualin Combes on: 08/31/2019 10:32 AM   Modules accepted: Orders

## 2019-08-31 NOTE — Assessment & Plan Note (Signed)
Tetanus UTD, influenza vaccination provided today. Encouraged to work on diet, increase water. Exam today unremarkable. Labs pending. Follow up in 1 year for CPE.

## 2019-08-31 NOTE — Assessment & Plan Note (Signed)
Repeat lipids pending. Encouraged to work on diet.

## 2019-08-31 NOTE — Progress Notes (Signed)
Subjective:    Patient ID: Ethan Cline, male    DOB: 27-Feb-1981, 38 y.o.   MRN: [id number]  HPI  Ethan Cline is a 38 year old male who presents today for complete physical.  Immunizations: -Tetanus: Completed in 2020 -Influenza: Declines   Diet: He endorses a healthy diet with home cooked meals, meat/some vegetables/starch. Desserts 2-3 times weekly. Drinking mostly soda, coffee, water, Gatorade Exercise: He is active at work, doing push ups at home.  Eye exam: Completed in 2019 Dental exam: Completes semi-annually   BP Readings from Last 3 Encounters:  08/31/19 116/72  08/11/18 120/78  06/03/17 122/76     Review of Systems  Constitutional: Negative for unexpected weight change.  HENT: Negative for rhinorrhea.   Respiratory: Negative for cough and shortness of breath.   Cardiovascular: Negative for chest pain.  Gastrointestinal: Negative for constipation and diarrhea.  Genitourinary: Negative for difficulty urinating.  Musculoskeletal: Negative for arthralgias and myalgias.  Skin: Negative for rash.  Allergic/Immunologic: Negative for environmental allergies.  Neurological: Negative for dizziness, numbness and headaches.  Psychiatric/Behavioral: The patient is not nervous/anxious.        Past Medical History:  Diagnosis Date  . Chicken pox   . Rocky Mountain spotted fever      Social History   Socioeconomic History  . Marital status: Married    Spouse name: Not on file  . Number of children: Not on file  . Years of education: Not on file  . Highest education level: Not on file  Occupational History  . Not on file  Social Needs  . Financial resource strain: Not on file  . Food insecurity    Worry: Not on file    Inability: Not on file  . Transportation needs    Medical: Not on file    Non-medical: Not on file  Tobacco Use  . Smoking status: Current Every Day Smoker    Packs/day: 0.50    Years: 20.00    Pack years: 10.00    Types: Cigarettes  .  Smokeless tobacco: Never Used  Substance and Sexual Activity  . Alcohol use: No    Alcohol/week: 0.0 standard drinks  . Drug use: Not on file  . Sexual activity: Not on file  Lifestyle  . Physical activity    Days per week: Not on file    Minutes per session: Not on file  . Stress: Not on file  Relationships  . Social Herbalist on phone: Not on file    Gets together: Not on file    Attends religious service: Not on file    Active member of club or organization: Not on file    Attends meetings of clubs or organizations: Not on file    Relationship status: Not on file  . Intimate partner violence    Fear of current or ex partner: Not on file    Emotionally abused: Not on file    Physically abused: Not on file    Forced sexual activity: Not on file  Other Topics Concern  . Not on file  Social History Narrative   Married.   1 child.   Highest level of education High School.   Works as a Theme park manager.   Enjoys boating, fishing, being outdoors, snowboarding.    No past surgical history on file.  Family History  Problem Relation Age of Onset  . Arthritis Mother   . Heart attack Father 80  No Known Allergies  No current outpatient medications on file prior to visit.   No current facility-administered medications on file prior to visit.     BP 116/72   Pulse 80   Temp 97.6 F (36.4 C) (Temporal)   Ht 6' (1.829 m)   Wt 151 lb (68.5 kg)   SpO2 98%   BMI 20.48 kg/m    Objective:   Physical Exam  Constitutional: He is oriented to person, place, and time. He appears well-nourished.  HENT:  Right Ear: Tympanic membrane and ear canal normal.  Left Ear: Tympanic membrane and ear canal normal.  Mouth/Throat: Oropharynx is clear and moist.  Eyes: Pupils are equal, round, and reactive to light. EOM are normal.  Neck: Neck supple.  Cardiovascular: Normal rate and regular rhythm.  Respiratory: Effort normal and breath sounds normal.   GI: Soft. Bowel sounds are normal. There is no abdominal tenderness.  Musculoskeletal: Normal range of motion.  Neurological: He is alert and oriented to person, place, and time.  Skin: Skin is warm and dry.  Psychiatric: He has a normal mood and affect.           Assessment & Plan:

## 2019-08-31 NOTE — Patient Instructions (Signed)
Stop by the lab prior to leaving today. I will notify you of your results once received.   Continue exercising. You should be getting 150 minutes of moderate intensity exercise weekly.  It's important to improve your diet by reducing consumption of fast food, fried food, processed snack foods, sugary drinks. Increase consumption of fresh vegetables and fruits, whole grains, water.  Ensure you are drinking 64 ounces of water daily.  It was a pleasure to see you today!   Preventive Care 49-52 Years Old, Male Preventive care refers to lifestyle choices and visits with your health care provider that can promote health and wellness. This includes:  A yearly physical exam. This is also called an annual well check.  Regular dental and eye exams.  Immunizations.  Screening for certain conditions.  Healthy lifestyle choices, such as eating a healthy diet, getting regular exercise, not using drugs or products that contain nicotine and tobacco, and limiting alcohol use. What can I expect for my preventive care visit? Physical exam Your health care provider will check:  Height and weight. These may be used to calculate body mass index (BMI), which is a measurement that tells if you are at a healthy weight.  Heart rate and blood pressure.  Your skin for abnormal spots. Counseling Your health care provider may ask you questions about:  Alcohol, tobacco, and drug use.  Emotional well-being.  Home and relationship well-being.  Sexual activity.  Eating habits.  Work and work Statistician. What immunizations do I need?  Influenza (flu) vaccine  This is recommended every year. Tetanus, diphtheria, and pertussis (Tdap) vaccine  You may need a Td booster every 10 years. Varicella (chickenpox) vaccine  You may need this vaccine if you have not already been vaccinated. Human papillomavirus (HPV) vaccine  If recommended by your health care provider, you may need three doses over 6  months. Measles, mumps, and rubella (MMR) vaccine  You may need at least one dose of MMR. You may also need a second dose. Meningococcal conjugate (MenACWY) vaccine  One dose is recommended if you are 3-37 years old and a Market researcher living in a residence hall, or if you have one of several medical conditions. You may also need additional booster doses. Pneumococcal conjugate (PCV13) vaccine  You may need this if you have certain conditions and were not previously vaccinated. Pneumococcal polysaccharide (PPSV23) vaccine  You may need one or two doses if you smoke cigarettes or if you have certain conditions. Hepatitis A vaccine  You may need this if you have certain conditions or if you travel or work in places where you may be exposed to hepatitis A. Hepatitis B vaccine  You may need this if you have certain conditions or if you travel or work in places where you may be exposed to hepatitis B. Haemophilus influenzae type b (Hib) vaccine  You may need this if you have certain risk factors. You may receive vaccines as individual doses or as more than one vaccine together in one shot (combination vaccines). Talk with your health care provider about the risks and benefits of combination vaccines. What tests do I need? Blood tests  Lipid and cholesterol levels. These may be checked every 5 years starting at age 47.  Hepatitis C test.  Hepatitis B test. Screening   Diabetes screening. This is done by checking your blood sugar (glucose) after you have not eaten for a while (fasting).  Sexually transmitted disease (STD) testing. Talk with your health care  provider about your test results, treatment options, and if necessary, the need for more tests. Follow these instructions at home: Eating and drinking   Eat a diet that includes fresh fruits and vegetables, whole grains, lean protein, and low-fat dairy products.  Take vitamin and mineral supplements as  recommended by your health care provider.  Do not drink alcohol if your health care provider tells you not to drink.  If you drink alcohol: ? Limit how much you have to 0-2 drinks a day. ? Be aware of how much alcohol is in your drink. In the [place] one drink equals one 12 oz bottle of beer (355 mL), one 5 oz glass of wine (148 mL), or one 1 oz glass of hard liquor (44 mL). Lifestyle  Take daily care of your teeth and gums.  Stay active. Exercise for at least 30 minutes on 5 or more days each week.  Do not use any products that contain nicotine or tobacco, such as cigarettes, e-cigarettes, and chewing tobacco. If you need help quitting, ask your health care provider.  If you are sexually active, practice safe sex. Use a condom or other form of protection to prevent STIs (sexually transmitted infections). What's next?  Go to your health care provider once a year for a well check visit.  Ask your health care provider how often you should have your eyes and teeth checked.  Stay up to date on all vaccines. This information is not intended to replace advice given to you by your health care provider. Make sure you discuss any questions you have with your health care provider. Document Released: 12/30/2001 Document Revised: 10/28/2018 Document Reviewed: 10/28/2018 Elsevier Patient Education  2020 [employer].

## 2019-08-31 NOTE — Assessment & Plan Note (Signed)
Repeat A1C pending. Encouraged to reduce soda/sweet tea, increase water.

## 2019-09-05 ENCOUNTER — Encounter: Payer: Self-pay | Admitting: *Deleted

## 2019-09-12 ENCOUNTER — Telehealth: Payer: Self-pay | Admitting: Primary Care

## 2019-09-12 NOTE — Telephone Encounter (Signed)
Place form in [name] Clark's inbox 

## 2019-09-12 NOTE — Telephone Encounter (Signed)
Pt's wife dropped off a physical form to be filled out. Placed in [place] tower.

## 2019-09-12 NOTE — Telephone Encounter (Signed)
Completed and handed to [name]. 

## 2019-09-13 NOTE — Telephone Encounter (Signed)
Per DPR, left detail message that form is completed and left in the front office for pick up

## 2019-12-01 ENCOUNTER — Other Ambulatory Visit: Payer: Self-pay | Admitting: Primary Care

## 2019-12-01 DIAGNOSIS — R7989 Other specified abnormal findings of blood chemistry: Secondary | ICD-10-CM

## 2019-12-07 ENCOUNTER — Other Ambulatory Visit: Payer: Commercial Managed Care - PPO

## 2019-12-07 ENCOUNTER — Other Ambulatory Visit: Payer: Self-pay

## 2020-06-08 ENCOUNTER — Ambulatory Visit (INDEPENDENT_AMBULATORY_CARE_PROVIDER_SITE_OTHER): Payer: Commercial Managed Care - PPO | Admitting: Primary Care

## 2020-06-08 ENCOUNTER — Other Ambulatory Visit: Payer: Self-pay

## 2020-06-08 ENCOUNTER — Encounter: Payer: Self-pay | Admitting: Primary Care

## 2020-06-08 VITALS — BP 116/74 | HR 85 | Temp 96.5°F | Ht 72.0 in | Wt 152.0 lb

## 2020-06-08 DIAGNOSIS — Z720 Tobacco use: Secondary | ICD-10-CM

## 2020-06-08 DIAGNOSIS — R229 Localized swelling, mass and lump, unspecified: Secondary | ICD-10-CM | POA: Insufficient documentation

## 2020-06-08 DIAGNOSIS — R7303 Prediabetes: Secondary | ICD-10-CM | POA: Diagnosis not present

## 2020-06-08 DIAGNOSIS — Z0000 Encounter for general adult medical examination without abnormal findings: Secondary | ICD-10-CM | POA: Diagnosis not present

## 2020-06-08 DIAGNOSIS — E781 Pure hyperglyceridemia: Secondary | ICD-10-CM | POA: Diagnosis not present

## 2020-06-08 LAB — COMPREHENSIVE METABOLIC PANEL
ALT: 14 U/L (ref 0–53)
AST: 16 U/L (ref 0–37)
Albumin: 4.5 g/dL (ref 3.5–5.2)
Alkaline Phosphatase: 84 U/L (ref 39–117)
BUN: 16 mg/dL (ref 6–23)
CO2: 28 mEq/L (ref 19–32)
Calcium: 9.4 mg/dL (ref 8.4–10.5)
Chloride: 103 mEq/L (ref 96–112)
Creatinine, Ser: 0.92 mg/dL (ref 0.40–1.50)
GFR: 91.06 mL/min (ref >60.00)
Glucose, Bld: 81 mg/dL (ref 70–99)
Potassium: 4.5 mEq/L (ref 3.5–5.1)
Sodium: 138 mEq/L (ref 135–145)
Total Bilirubin: 1 mg/dL (ref 0.2–1.2)
Total Protein: 6.7 g/dL (ref 6.0–8.3)

## 2020-06-08 LAB — CBC WITH DIFFERENTIAL/PLATELET
Basophils Absolute: 0 10*3/uL (ref 0.0–0.1)
Basophils Relative: 0.4 % (ref 0.0–3.0)
Eosinophils Absolute: 0.1 10*3/uL (ref 0.0–0.7)
Eosinophils Relative: 1.3 % (ref 0.0–5.0)
HCT: 49 % (ref 39.0–52.0)
Hemoglobin: 16.8 g/dL (ref 13.0–17.0)
Lymphocytes Relative: 19.6 % (ref 12.0–46.0)
Lymphs Abs: 2.1 10*3/uL (ref 0.7–4.0)
MCHC: 34.3 g/dL (ref 30.0–36.0)
MCV: 88.7 fl (ref 78.0–100.0)
Monocytes Absolute: 0.6 10*3/uL (ref 0.1–1.0)
Monocytes Relative: 5.8 % (ref 3.0–12.0)
Neutro Abs: 7.9 10*3/uL — ABNORMAL HIGH (ref 1.4–7.7)
Neutrophils Relative %: 72.9 % (ref 43.0–77.0)
Platelets: 248 10*3/uL (ref 150.0–400.0)
RBC: 5.53 Mil/uL (ref 4.22–5.81)
RDW: 13.2 % (ref 11.5–15.5)
WBC: 10.9 10*3/uL — ABNORMAL HIGH (ref 4.0–10.5)

## 2020-06-08 LAB — LIPID PANEL
Cholesterol: 201 mg/dL — ABNORMAL HIGH (ref 0–200)
HDL: 44.5 mg/dL (ref >39.00)
LDL Cholesterol: 128 mg/dL — ABNORMAL HIGH (ref 0–99)
NonHDL: 156.46
Total CHOL/HDL Ratio: 5
Triglycerides: 141 mg/dL (ref 0.0–149.0)
VLDL: 28.2 mg/dL (ref 0.0–40.0)

## 2020-06-08 LAB — HEMOGLOBIN A1C: Hgb A1c MFr Bld: 5.6 % (ref 4.6–6.5)

## 2020-06-08 NOTE — Patient Instructions (Signed)
You will be contacted regarding your ultrasound.  Please let us know if you have not been contacted within two weeks.   It's important to improve your diet by reducing consumption of fast food, fried food, processed snack foods, sugary drinks. Increase consumption of fresh vegetables and fruits, whole grains, water.  Ensure you are drinking 64 ounces of water daily.  Stop by the lab prior to leaving today. I will notify you of your results once received.   It was a pleasure to see you today!   Preventive Care 40-59 Years Old, Male Preventive care refers to lifestyle choices and visits with your health care provider that can promote health and wellness. This includes:  A yearly physical exam. This is also called an annual well check.  Regular dental and eye exams.  Immunizations.  Screening for certain conditions.  Healthy lifestyle choices, such as eating a healthy diet, getting regular exercise, not using drugs or products that contain nicotine and tobacco, and limiting alcohol use. What can I expect for my preventive care visit? Physical exam Your health care provider will check:  Height and weight. These may be used to calculate body mass index (BMI), which is a measurement that tells if you are at a healthy weight.  Heart rate and blood pressure.  Your skin for abnormal spots. Counseling Your health care provider may ask you questions about:  Alcohol, tobacco, and drug use.  Emotional well-being.  Home and relationship well-being.  Sexual activity.  Eating habits.  Work and work Statistician. What immunizations do I need?  Influenza (flu) vaccine  This is recommended every year. Tetanus, diphtheria, and pertussis (Tdap) vaccine  You may need a Td booster every 10 years. Varicella (chickenpox) vaccine  You may need this vaccine if you have not already been vaccinated. Zoster (shingles) vaccine  You may need this after age 39. Measles, mumps, and rubella  (MMR) vaccine  You may need at least one dose of MMR if you were born in 1957 or later. You may also need a second dose. Pneumococcal conjugate (PCV13) vaccine  You may need this if you have certain conditions and were not previously vaccinated. Pneumococcal polysaccharide (PPSV23) vaccine  You may need one or two doses if you smoke cigarettes or if you have certain conditions. Meningococcal conjugate (MenACWY) vaccine  You may need this if you have certain conditions. Hepatitis A vaccine  You may need this if you have certain conditions or if you travel or work in places where you may be exposed to hepatitis A. Hepatitis B vaccine  You may need this if you have certain conditions or if you travel or work in places where you may be exposed to hepatitis B. Haemophilus influenzae type b (Hib) vaccine  You may need this if you have certain risk factors. Human papillomavirus (HPV) vaccine  If recommended by your health care provider, you may need three doses over 6 months. You may receive vaccines as individual doses or as more than one vaccine together in one shot (combination vaccines). Talk with your health care provider about the risks and benefits of combination vaccines. What tests do I need? Blood tests  Lipid and cholesterol levels. These may be checked every 5 years, or more frequently if you are over 25 years old.  Hepatitis C test.  Hepatitis B test. Screening  Lung cancer screening. You may have this screening every year starting at age 61 if you have a 30-pack-year history of smoking and currently smoke  or have quit within the past 15 years.  Prostate cancer screening. Recommendations will vary depending on your family history and other risks.  Colorectal cancer screening. All adults should have this screening starting at age 7 and continuing until age 35. Your health care provider may recommend screening at age 62 if you are at increased risk. You will have tests  every 1-10 years, depending on your results and the type of screening test.  Diabetes screening. This is done by checking your blood sugar (glucose) after you have not eaten for a while (fasting). You may have this done every 1-3 years.  Sexually transmitted disease (STD) testing. Follow these instructions at home: Eating and drinking  Eat a diet that includes fresh fruits and vegetables, whole grains, lean protein, and low-fat dairy products.  Take vitamin and mineral supplements as recommended by your health care provider.  Do not drink alcohol if your health care provider tells you not to drink.  If you drink alcohol: ? Limit how much you have to 0-2 drinks a day. ? Be aware of how much alcohol is in your drink. In the [place] one drink equals one 12 oz bottle of beer (355 mL), one 5 oz glass of wine (148 mL), or one 1 oz glass of hard liquor (44 mL). Lifestyle  Take daily care of your teeth and gums.  Stay active. Exercise for at least 30 minutes on 5 or more days each week.  Do not use any products that contain nicotine or tobacco, such as cigarettes, e-cigarettes, and chewing tobacco. If you need help quitting, ask your health care provider.  If you are sexually active, practice safe sex. Use a condom or other form of protection to prevent STIs (sexually transmitted infections).  Talk with your health care provider about taking a low-dose aspirin every day starting at age 53. What's next?  Go to your health care provider once a year for a well check visit.  Ask your health care provider how often you should have your eyes and teeth checked.  Stay up to date on all vaccines. This information is not intended to replace advice given to you by your health care provider. Make sure you discuss any questions you have with your health care provider. Document Revised: 10/28/2018 Document Reviewed: 10/28/2018 Elsevier Patient Education  2020 [employer].

## 2020-06-08 NOTE — Assessment & Plan Note (Signed)
Continues to smoker 1 PPD of cigarettes, not ready to quit, offered my assistance when ready.

## 2020-06-08 NOTE — Progress Notes (Signed)
Subjective:    Patient ID: Ethan Cline, male    DOB: 11-19-1980, 39 y.o.   MRN: [id number]  HPI  This visit occurred during the SARS-CoV-2 public health emergency.  Safety protocols were in place, including screening questions prior to the visit, additional usage of staff PPE, and extensive cleaning of exam room while observing appropriate contact time as indicated for disinfecting solutions.   Ethan Cline is a 39 year old male who presents today for complete physical.  Immunizations: -Tetanus: Completed in 2013 -Influenza: Due this season  -Covid-19: Declines   Diet: He endorses a fair diet.  Exercise: He is active at work, some push ups.   Eye exam: Completed in 2020 Dental exam: No recent exam  BP Readings from Last 3 Encounters:  06/08/20 116/74  08/31/19 116/72  08/11/18 120/78     Review of Systems  Constitutional: Negative for unexpected weight change.  HENT: Negative for rhinorrhea.   Respiratory: Negative for cough and shortness of breath.   Cardiovascular: Negative for chest pain.  Gastrointestinal: Negative for constipation and diarrhea.  Genitourinary: Negative for difficulty urinating.  Musculoskeletal: Negative for arthralgias and myalgias.  Skin: Negative for rash.       Left facial mass to cheek. No pain or color changes.   Allergic/Immunologic: Negative for environmental allergies.  Neurological: Negative for dizziness, numbness and headaches.  Psychiatric/Behavioral: The patient is not nervous/anxious.        Past Medical History:  Diagnosis Date  . Chicken pox   . [place] spotted fever      Social History   Socioeconomic History  . Marital status: Married    Spouse name: Not on file  . Number of children: Not on file  . Years of education: Not on file  . Highest education level: Not on file  Occupational History  . Not on file  Tobacco Use  . Smoking status: Current Every Day Smoker    Packs/day: 0.50    Years: 20.00    Pack  years: 10.00    Types: Cigarettes  . Smokeless tobacco: Never Used  Substance and Sexual Activity  . Alcohol use: No    Alcohol/week: 0.0 standard drinks  . Drug use: Not on file  . Sexual activity: Not on file  Other Topics Concern  . Not on file  Social History Narrative   Married.   1 child.   Highest level of education High School.   Works as a Camera operator.   Enjoys boating, fishing, being outdoors, snowboarding.   Social Determinants of Health   Financial Resource Strain:   . Difficulty of Paying Living Expenses:   Food Insecurity:   . Worried About Programme researcher, broadcasting/film/video in the Last Year:   . Barista in the Last Year:   Transportation Needs:   . Freight forwarder (Medical):   Marland Kitchen Lack of Transportation (Non-Medical):   Physical Activity:   . Days of Exercise per Week:   . Minutes of Exercise per Session:   Stress:   . Feeling of Stress :   Social Connections:   . Frequency of Communication with Friends and Family:   . Frequency of Social Gatherings with Friends and Family:   . Attends Religious Services:   . Active Member of Clubs or Organizations:   . Attends Banker Meetings:   Marland Kitchen Marital Status:   Intimate Partner Violence:   . Fear of Current or Ex-Partner:   .  Emotionally Abused:   Marland Kitchen Physically Abused:   . Sexually Abused:     No past surgical history on file.  Family History  Problem Relation Age of Onset  . Arthritis Mother   . Heart attack Father 58    No Known Allergies  No current outpatient medications on file prior to visit.   No current facility-administered medications on file prior to visit.    BP 116/74   Pulse 85   Temp (!) 96.5 F (35.8 C) (Temporal)   Ht 6' (1.829 m)   Wt 152 lb (68.9 kg)   SpO2 98%   BMI 20.61 kg/m    Objective:   Physical Exam HENT:     Right Ear: Tympanic membrane and ear canal normal.     Left Ear: Tympanic membrane and ear canal normal.  Eyes:      Pupils: Pupils are equal, round, and reactive to light.  Cardiovascular:     Rate and Rhythm: Normal rate and regular rhythm.  Pulmonary:     Effort: Pulmonary effort is normal.     Breath sounds: Normal breath sounds.  Abdominal:     General: Bowel sounds are normal.     Palpations: Abdomen is soft.     Tenderness: There is no abdominal tenderness.  Musculoskeletal:        General: Normal range of motion.     Cervical back: Neck supple.  Skin:    General: Skin is warm and dry.     Comments: 1 cm rounded, subcutaneous, soft, non tender, immobile mass to left cheek.   Neurological:     Mental Status: He is alert and oriented to person, place, and time.     Cranial Nerves: No cranial nerve deficit.     Deep Tendon Reflexes:     Reflex Scores:      Patellar reflexes are 2+ on the right side and 2+ on the left side. Psychiatric:        Mood and Affect: Mood normal.            Assessment & Plan:

## 2020-06-08 NOTE — Assessment & Plan Note (Signed)
Located to left cheek.  Exam today suggests cyst.  Ultrasound soft tissue pending.

## 2020-06-08 NOTE — Assessment & Plan Note (Signed)
Encouraged a healthy diet and exercise. Repeat lipids pending.

## 2020-06-08 NOTE — Assessment & Plan Note (Signed)
Tetanus UTD. Strongly encouraged tobacco cessation. Discussed the importance of a healthy diet and regular exercise in order for weight loss, and to reduce the risk of any potential medical problems.  Exam today stable. Labs pending.

## 2020-06-08 NOTE — Assessment & Plan Note (Signed)
Repeat A1C pending. 

## 2020-06-11 ENCOUNTER — Encounter: Payer: Self-pay | Admitting: *Deleted

## 2020-06-18 ENCOUNTER — Ambulatory Visit
Admission: RE | Admit: 2020-06-18 | Discharge: 2020-06-18 | Disposition: A | Discharge status: Home / Self Care | Payer: Commercial Managed Care - PPO | Source: Ambulatory Visit | Attending: Primary Care | Admitting: Primary Care

## 2020-06-18 DIAGNOSIS — R229 Localized swelling, mass and lump, unspecified: Secondary | ICD-10-CM

## 2020-06-21 ENCOUNTER — Telehealth: Payer: Self-pay | Admitting: Primary Care

## 2020-06-21 NOTE — Telephone Encounter (Signed)
Spoken to patient and left form in the front office for pick up

## 2020-06-21 NOTE — Telephone Encounter (Signed)
Spouse dropped off proof of physical form to be filled out in kate's in box

## 2020-06-21 NOTE — Telephone Encounter (Signed)
Message left for patient's wife to return my call.  

## 2020-06-21 NOTE — Telephone Encounter (Signed)
Patient's wife returned [name]'s call.

## 2020-06-21 NOTE — Telephone Encounter (Signed)
Noted  

## 2020-06-21 NOTE — Telephone Encounter (Signed)
Completed and placed in Chans inbox. 

## 2020-11-02 ENCOUNTER — Telehealth: Payer: Self-pay

## 2020-11-02 DIAGNOSIS — R229 Localized swelling, mass and lump, unspecified: Secondary | ICD-10-CM

## 2020-11-02 NOTE — Telephone Encounter (Signed)
Reviewed result notes from patients last visit. We recommended dermatology evaluation in Summer 2021, he declined at the time and wanted to speak with his wife. He never phoned [place] back.  Will place referral, agree that he needs dermatology evaluation.

## 2020-11-02 NOTE — Telephone Encounter (Signed)
Pt's wife has been notified of referral placed.

## 2020-11-02 NOTE — Telephone Encounter (Signed)
Spoke to pt's wife (DPR) and she has requested a dermatology referral be placed for pt's mass in check.

## 2021-04-17 IMAGING — US US SOFT TISSUE HEAD/NECK
1 series · 6 of 6 positions shown · non-contrast
Comparison: None.

CLINICAL DATA: 39-year-old male with a history of left cheek skin
mass

EXAM:
ULTRASOUND OF HEAD/NECK SOFT TISSUES
TECHNIQUE: Ultrasound examination of the head and neck soft tissues was
performed in the area of clinical concern.

[Series 1: us soft tissue head/neck · 0.04mm/px · 6 acquisitions, 6 frames shown]
[im 1/6]
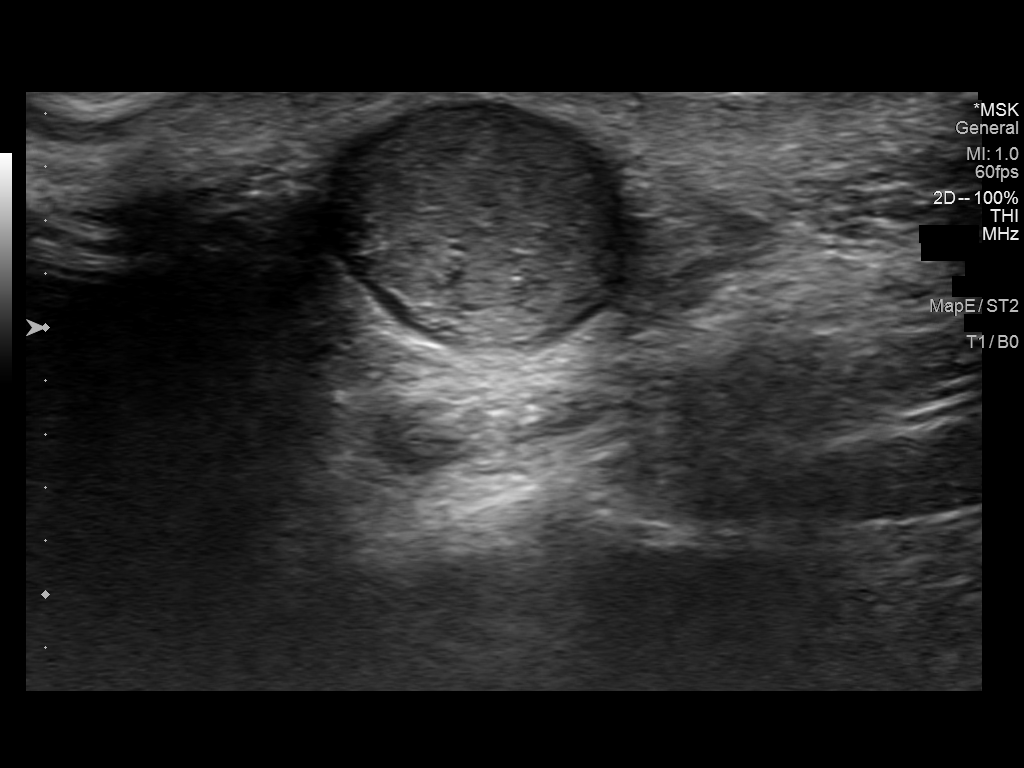
[im 2/6]
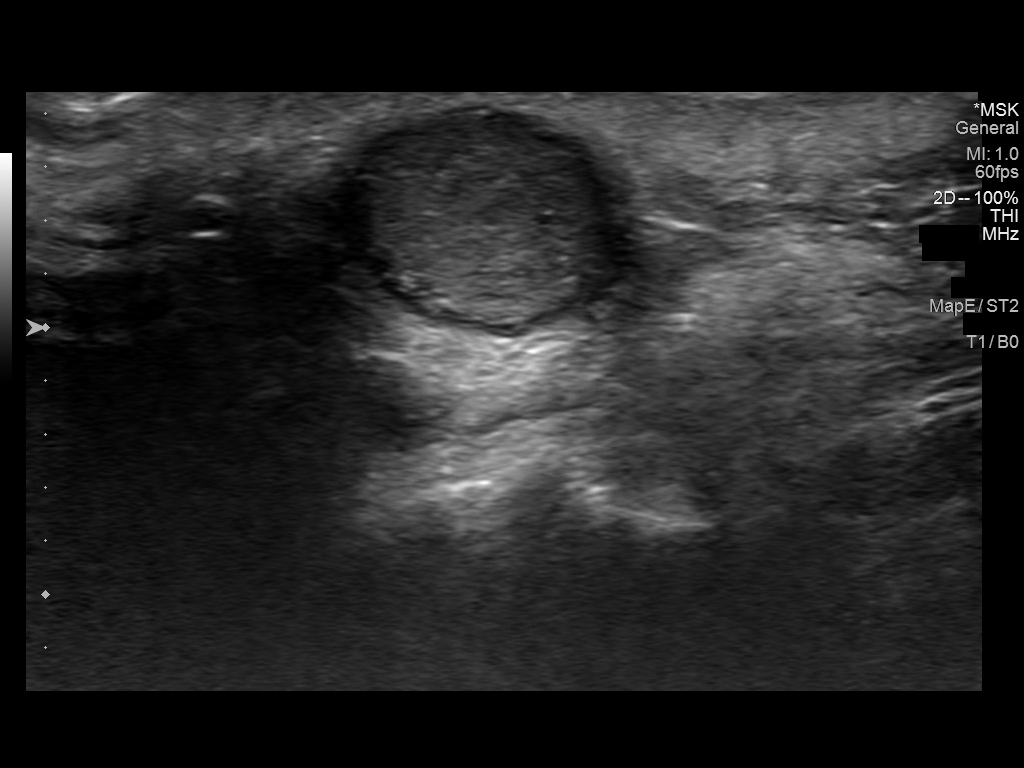
[im 3/6]
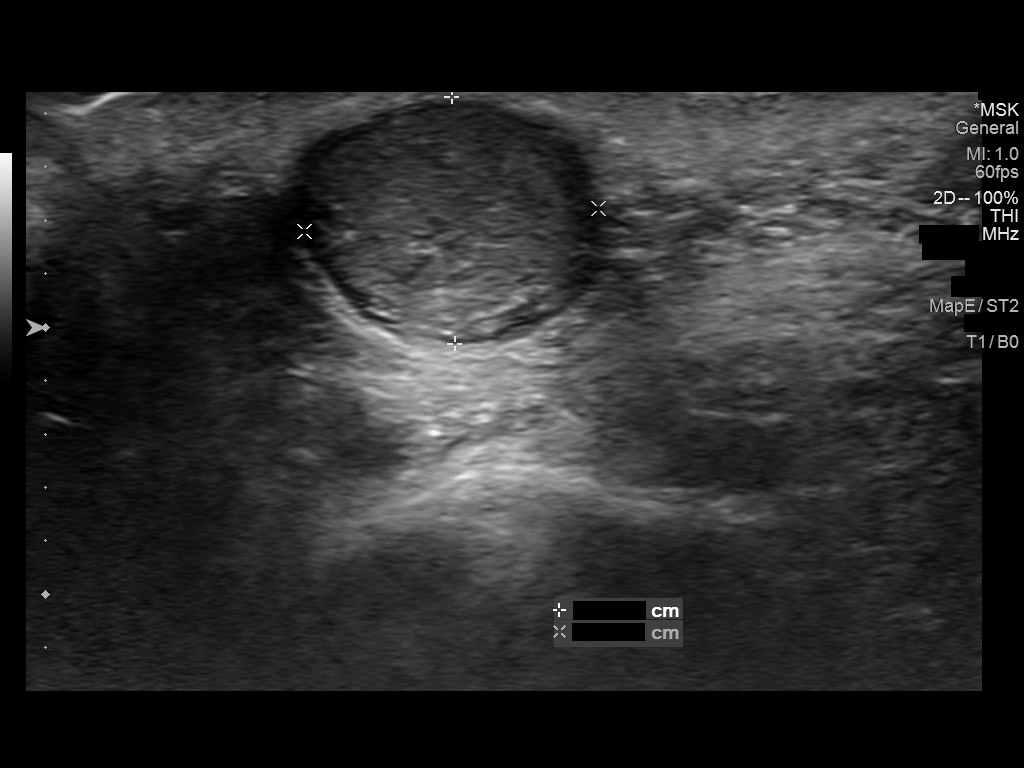
[im 4/6]
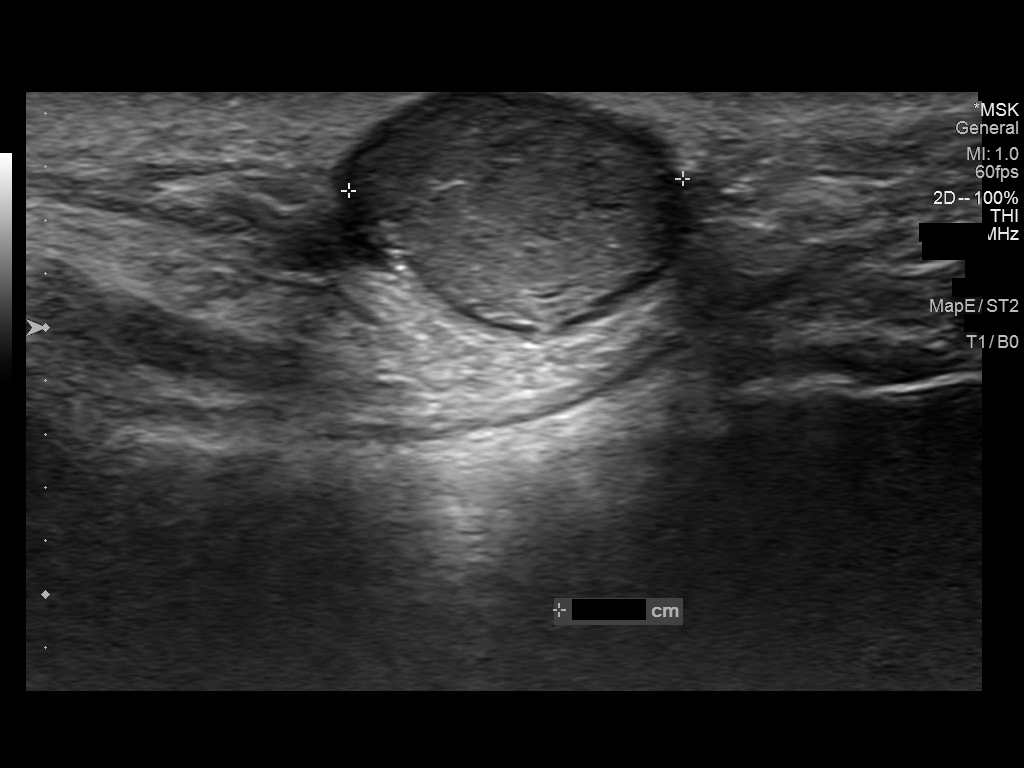
[im 5/6]
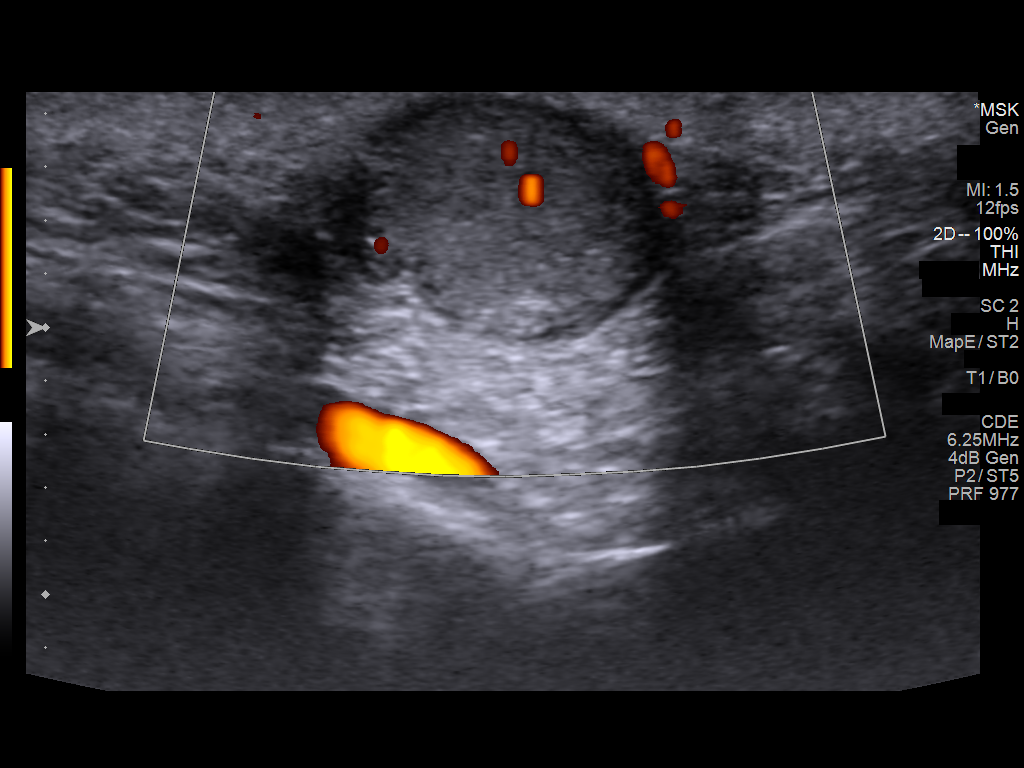
[im 6/6]
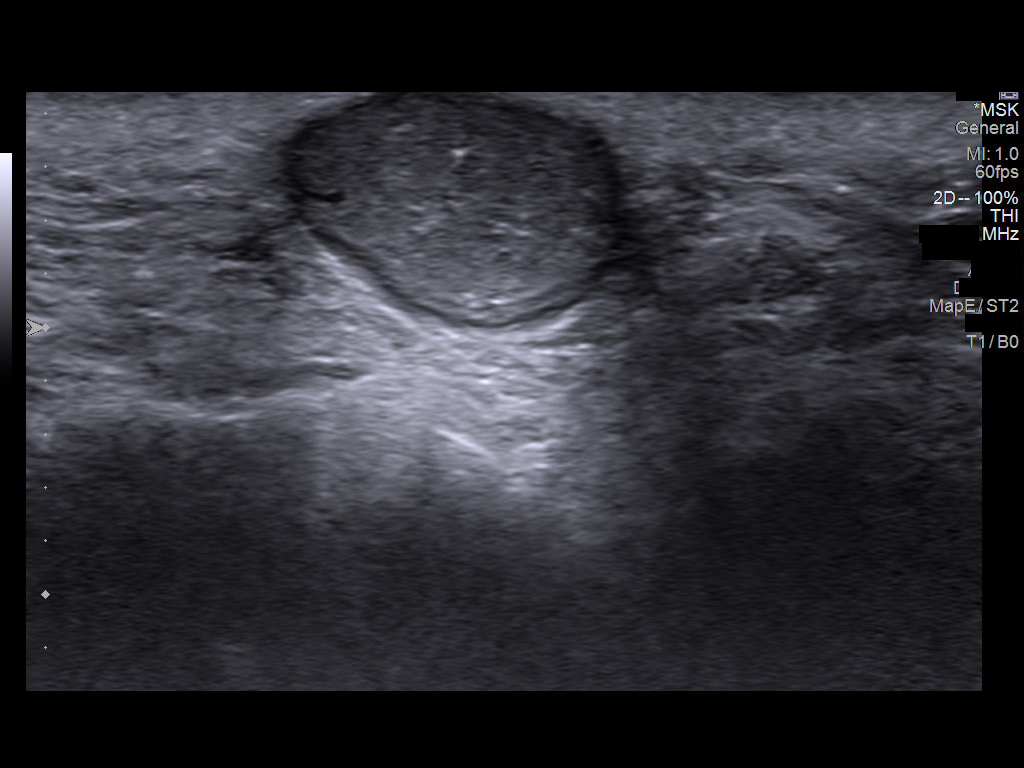

[6 of 6 positions shown; findings below may reference images not displayed]

FINDINGS: Grayscale and color duplex performed in the region clinical concern.
Soft tissue lesion within the superficial soft tissues of the left
cheek measuring 1.3 cm x 0.9 cm x 1.1 cm. Relatively heterogeneously
hypoechoic with through transmission and well-defined border. No
significant internal color flow.
IMPRESSION: Nonspecific soft tissue lesion in the left cheek measuring as large
as 1.3 cm.

## 2021-08-16 ENCOUNTER — Encounter: Payer: Commercial Managed Care - PPO | Admitting: Primary Care

## 2022-08-30 ENCOUNTER — Other Ambulatory Visit: Payer: Self-pay

## 2022-08-30 ENCOUNTER — Encounter: Payer: Self-pay | Admitting: Emergency Medicine

## 2022-08-30 ENCOUNTER — Emergency Department
Admission: EM | Admit: 2022-08-30 | Discharge: 2022-08-30 | Disposition: A | Discharge status: Home / Self Care | Payer: Self-pay | Attending: Emergency Medicine | Admitting: Emergency Medicine

## 2022-08-30 ENCOUNTER — Emergency Department: Payer: Self-pay

## 2022-08-30 DIAGNOSIS — R071 Chest pain on breathing: Secondary | ICD-10-CM

## 2022-08-30 DIAGNOSIS — R079 Chest pain, unspecified: Secondary | ICD-10-CM | POA: Insufficient documentation

## 2022-08-30 DIAGNOSIS — R051 Acute cough: Secondary | ICD-10-CM | POA: Insufficient documentation

## 2022-08-30 DIAGNOSIS — Z1152 Encounter for screening for COVID-19: Secondary | ICD-10-CM | POA: Insufficient documentation

## 2022-08-30 LAB — CBC
HCT: 50.6 % (ref 39.0–52.0)
Hemoglobin: 17.5 g/dL — ABNORMAL HIGH (ref 13.0–17.0)
MCH: 30.5 pg (ref 26.0–34.0)
MCHC: 34.6 g/dL (ref 30.0–36.0)
MCV: 88.3 fL (ref 80.0–100.0)
Platelets: 239 10*3/uL (ref 150–400)
RBC: 5.73 MIL/uL (ref 4.22–5.81)
RDW: 11.9 % (ref 11.5–15.5)
WBC: 15.8 10*3/uL — ABNORMAL HIGH (ref 4.0–10.5)
nRBC: 0 % (ref 0.0–0.2)

## 2022-08-30 LAB — BASIC METABOLIC PANEL WITH GFR
Anion gap: 9 (ref 5–15)
BUN: 16 mg/dL (ref 6–20)
CO2: 22 mmol/L (ref 22–32)
Calcium: 8.9 mg/dL (ref 8.9–10.3)
Chloride: 106 mmol/L (ref 98–111)
Creatinine, Ser: 0.88 mg/dL (ref 0.61–1.24)
GFR, Estimated: >60 mL/min
Glucose, Bld: 109 mg/dL — ABNORMAL HIGH (ref 70–99)
Potassium: 3.8 mmol/L (ref 3.5–5.1)
Sodium: 137 mmol/L (ref 135–145)

## 2022-08-30 LAB — RESP PANEL BY RT-PCR (FLU A&B, COVID) ARPGX2
Influenza A by PCR: NEGATIVE
Influenza B by PCR: NEGATIVE
SARS Coronavirus 2 by RT PCR: NEGATIVE

## 2022-08-30 LAB — TROPONIN I (HIGH SENSITIVITY)
Troponin I (High Sensitivity): 3 ng/L (ref <18)
Troponin I (High Sensitivity): 3 ng/L

## 2022-08-30 LAB — D-DIMER, QUANTITATIVE: D-Dimer, Quant: <0.27 ug{FEU}/mL (ref 0.00–0.50)

## 2022-08-30 MED ORDER — PREDNISONE 50 MG PO TABS: 50.0000 mg | ORAL_TABLET | Freq: Every day | ORAL | 0 refills | Status: AC

## 2022-08-30 MED ORDER — DOXYCYCLINE MONOHYDRATE 100 MG PO TABS: 100.0000 mg | ORAL_TABLET | Freq: Two times a day (BID) | ORAL | 0 refills | Status: AC

## 2022-08-30 MED ORDER — KETOROLAC TROMETHAMINE 15 MG/ML IJ SOLN
15.0000 mg | Freq: Once | INTRAMUSCULAR | Status: AC
  Administered 2022-08-30: 15 mg via INTRAVENOUS
  Filled 2022-08-30: qty 1

## 2022-08-30 MED ORDER — ALBUTEROL SULFATE HFA 108 (90 BASE) MCG/ACT IN AERS: 2.0000 | INHALATION_SPRAY | RESPIRATORY_TRACT | 2 refills | Status: AC | PRN

## 2022-08-30 NOTE — Discharge Instructions (Signed)
You are seen in the emergency department for chest pain, cough and not feeling well.  Your chest x-ray did not show an obvious sign of pneumonia.  You had a screening test for blood clots and do not believe that you are having a blood clot as the cause of your problem.  Your troponin was negative do not believe that you are having a heart attack today.  It is important that you take your medications as prescribed.  Try to cut back on tobacco use.  Follow-up closely with your primary care physician on Monday and return to the emergency department for any worsening chest pain, shortness of breath or worsening symptoms.  Doxycycline - This medication can cause acid reflux.  It is important that you take it with food and drink plenty of water.  Do not lie down for 1 hour after taking this medication.  It also causes sun sensitivity so stay out of the sun or wear SPF while on this medication.  Pain control:  Ibuprofen (motrin/aleve/advil) - You can take 3-4 tablets (600-800 mg) every 6 hours as needed for pain/fever.  Acetaminophen (tylenol) - You can take 2 extra strength tablets (1000 mg) every 6 hours as needed for pain/fever.  You can alternate these medications or take them together.  Make sure you eat food/drink water when taking these medications.

## 2022-08-30 NOTE — ED Provider Notes (Signed)
[facility] Provider Note    Event Date/Time   First MD Initiated Contact with Patient 08/30/22 1107     (approximate)   History   Chest Pain and Shortness of Breath   HPI  Ethan Cline is a 41 y.o. male with past medical history significant for tobacco use, who presents to the emergency department with chest pain and shortness of breath.  Patient endorses 2 days of cough and congestion.  States that he did not feel well last night prior to going to sleep.  Woke up this morning and states that he felt worse with generalized weakness and fatigue.  States that he was having chest pain only with deep inspiration and coughing.  Denies any significant shortness of breath at rest.  Denies any hemoptysis.  No recent trips or travel.  No prior history of DVT or PE.  Daily tobacco use.  No prior stress testing or cardiac catheterization.     Physical Exam   Triage Vital Signs: ED Triage Vitals  Enc Vitals Group     BP 08/30/22 0934 138/89     Pulse Rate 08/30/22 0934 77     Resp 08/30/22 0934 18     Temp 08/30/22 0934 97.7 F (36.5 C)     Temp Source 08/30/22 0934 Oral     SpO2 08/30/22 0934 97 %     Weight 08/30/22 0934 155 lb (70.3 kg)     Height 08/30/22 0934 5\' 11"  (1.803 m)     Head Circumference --      Peak Flow --      Pain Score 08/30/22 0936 5     Pain Loc --      Pain Edu? --      Excl. in [place]? --     Most recent vital signs: Vitals:   08/30/22 0934 08/30/22 1234  BP: 138/89 121/70  Pulse: 77 69  Resp: 18 18  Temp: 97.7 F (36.5 C) 97.7 F (36.5 C)  SpO2: 97% 95%    Physical Exam Constitutional:      Appearance: He is well-developed.  HENT:     Head: Atraumatic.  Eyes:     Conjunctiva/sclera: Conjunctivae normal.  Cardiovascular:     Rate and Rhythm: Regular rhythm.     Heart sounds: Normal heart sounds.  Pulmonary:     Effort: Pulmonary effort is normal. No respiratory distress.     Comments: Focal rhonchi to the right lower  lung fields.  Mild end expiratory wheeze diffusely.  Coarse breath sounds. Abdominal:     Palpations: Abdomen is soft.     Tenderness: There is no abdominal tenderness.  Musculoskeletal:        General: Normal range of motion.     Cervical back: Normal range of motion.     Comments: No lower extremity edema unilateral leg swelling.  Skin:    General: Skin is warm.  Neurological:     Mental Status: He is alert and oriented to person, place, and time.          IMPRESSION / MDM / ASSESSMENT AND PLAN / ED COURSE  I reviewed the triage vital signs and the nursing notes.  41 year old male with past medical history significant for tobacco use, who presents to the emergency department with chest pain and cough.  On arrival afebrile, hemodynamically stable.  Differential diagnosis includes ACS, pneumonia, COVID, pericarditis, pulmonary embolism, undiagnosed COPD, bronchitis.  Clinical picture is not consistent with dissection.  Low risk Wells criteria, D-dimer is negative, have low suspicion for pulmonary embolism.  Heart score of 2, troponin is negative.  Low suspicion for ACS as the cause of his chest pain.  EKG without findings of pericarditis and no rub on exam.  Chest x-ray without focal findings consistent with pneumonia.  COVID and influenza testing are negative.  Given his focal findings on lung exam concern for developing pneumonia given his leukocytosis and cough.  Discussed smoking cessation.  Patient given a prescription for albuterol inhaler, steroids and doxycycline to cover for community-acquired pneumonia.  Discussed close follow-up with primary care physician.  Given return precautions for any worsening symptoms.      ED Results / Procedures / Treatments   Labs (all labs ordered are listed, but only abnormal results are displayed) Labs interpreted as -   COVID and influenza testing are negative.  Initial troponin is negative.  No significant anemia.  Creatinine at  baseline with no significant electrolyte abnormalities.  Labs Reviewed  BASIC METABOLIC PANEL - Abnormal; Notable for the following components:      Result Value   Glucose, Bld 109 (*)    All other components within normal limits  CBC - Abnormal; Notable for the following components:   WBC 15.8 (*)    Hemoglobin 17.5 (*)    All other components within normal limits  RESP PANEL BY RT-PCR (FLU A&B, COVID) [id number]  D-DIMER, QUANTITATIVE (NOT AT [facility])  TROPONIN I (HIGH SENSITIVITY)  TROPONIN I (HIGH SENSITIVITY)     EKG   My interpretation of the EKG -incomplete right bundle branch block. normal intervals.  No chamber enlargement.  No significant ST elevation or depression.  No signs of acute ischemia or dysrhythmia.  No tachycardic or bradycardic dysrhythmias while on cardiac telemetry.  RADIOLOGY My interpretation of imaging:   Chest x-ray with no focal findings consistent with pneumonia.   PROCEDURES:  Critical Care performed: No  Procedures  Patient's presentation is most consistent with acute presentation with potential threat to life or bodily function.   MEDICATIONS ORDERED IN ED: Medications  ketorolac (TORADOL) 15 MG/ML injection 15 mg (15 mg Intravenous Given 08/30/22 1146)    FINAL CLINICAL IMPRESSION(S) / ED DIAGNOSES   Final diagnoses:  Chest pain on breathing  Acute cough     Rx / DC Orders   ED Discharge Orders          Ordered    doxycycline (ADOXA) 100 MG tablet  2 times daily        08/30/22 1244    predniSONE (DELTASONE) 50 MG tablet  Daily with breakfast        08/30/22 1244    albuterol (VENTOLIN HFA) 108 (90 Base) MCG/ACT inhaler  Every 4 hours PRN        08/30/22 1244             Note:  This document was prepared using Dragon voice recognition software and may include unintentional dictation errors.   Nathaniel Man, MD 08/30/22 1916
